# Patient Record
Sex: Male | Born: 1965 | Race: White | Hispanic: No | Marital: Single | Smoking: Current every day smoker
Health system: Southern US, Community
[De-identification: ages and names within clinical notes are randomized; demographics above are authoritative.]

## PROBLEM LIST (undated history)

## (undated) DIAGNOSIS — E7800 Pure hypercholesterolemia, unspecified: Secondary | ICD-10-CM

## (undated) DIAGNOSIS — E119 Type 2 diabetes mellitus without complications: Secondary | ICD-10-CM

## (undated) HISTORY — PX: APPENDECTOMY: SHX54

---

## 2013-04-11 DIAGNOSIS — F3342 Major depressive disorder, recurrent, in full remission: Secondary | ICD-10-CM | POA: Diagnosis not present

## 2013-06-20 DIAGNOSIS — IMO0001 Reserved for inherently not codable concepts without codable children: Secondary | ICD-10-CM | POA: Diagnosis not present

## 2013-06-20 DIAGNOSIS — Z6834 Body mass index (BMI) 34.0-34.9, adult: Secondary | ICD-10-CM | POA: Diagnosis not present

## 2013-06-20 DIAGNOSIS — I10 Essential (primary) hypertension: Secondary | ICD-10-CM | POA: Diagnosis not present

## 2013-07-30 ENCOUNTER — Emergency Department (HOSPITAL_COMMUNITY)
Admission: EM | Admit: 2013-07-30 | Discharge: 2013-07-30 | Disposition: A | Discharge status: Home / Self Care | Payer: Medicare Other | Attending: Emergency Medicine | Admitting: Emergency Medicine

## 2013-07-30 ENCOUNTER — Encounter (HOSPITAL_COMMUNITY): Payer: Self-pay | Admitting: Emergency Medicine

## 2013-07-30 DIAGNOSIS — I10 Essential (primary) hypertension: Secondary | ICD-10-CM | POA: Insufficient documentation

## 2013-07-30 DIAGNOSIS — H9313 Tinnitus, bilateral: Secondary | ICD-10-CM

## 2013-07-30 DIAGNOSIS — F17200 Nicotine dependence, unspecified, uncomplicated: Secondary | ICD-10-CM | POA: Insufficient documentation

## 2013-07-30 DIAGNOSIS — R51 Headache: Secondary | ICD-10-CM | POA: Diagnosis not present

## 2013-07-30 DIAGNOSIS — E7800 Pure hypercholesterolemia, unspecified: Secondary | ICD-10-CM | POA: Diagnosis not present

## 2013-07-30 DIAGNOSIS — Z79899 Other long term (current) drug therapy: Secondary | ICD-10-CM | POA: Insufficient documentation

## 2013-07-30 DIAGNOSIS — H9319 Tinnitus, unspecified ear: Secondary | ICD-10-CM | POA: Diagnosis not present

## 2013-07-30 DIAGNOSIS — E119 Type 2 diabetes mellitus without complications: Secondary | ICD-10-CM | POA: Insufficient documentation

## 2013-07-30 HISTORY — DX: Type 2 diabetes mellitus without complications: E11.9

## 2013-07-30 HISTORY — DX: Pure hypercholesterolemia, unspecified: E78.00

## 2013-07-30 NOTE — Discharge Instructions (Signed)
Tinnitus Sounds you hear in your ears and coming from within the ear is called tinnitus. This can be a symptom of many ear disorders. It is often associated with hearing loss.  Tinnitus can be seen with:  Infections.  Ear blockages such as wax buildup.  Meniere's disease.  Ear damage.  Inherited.  Occupational causes. While irritating, it is not usually a threat to health. When the cause of the tinnitus is wax, infection in the middle ear, or foreign body it is easily treated. Hearing loss will usually be reversible.  TREATMENT  When treating the underlying cause does not get rid of tinnitus, it may be necessary to get rid of the unwanted sound by covering it up with more pleasant background noises. This may include music, the radio etc. Try [employer] website and App. There are tinnitus maskers which can be worn which produce background noise to cover up the tinnitus. Avoid all medications which tend to make tinnitus worse such as alcohol, caffeine, aspirin, and nicotine. There are many soothing background tapes such as rain, ocean, thunderstorms, etc. These soothing sounds help with sleeping or resting. Keep all follow-up appointments and referrals. This is important to identify the cause of the problem. It also helps avoid complications, impaired hearing, disability, or chronic pain. Document Released: 03/16/2005 Document Revised: 06/08/2011 Document Reviewed: 11/02/2007 [facility] Patient Information 2014 [employer], [place].  Not every illness can be identified during an emergency department visit, thus follow-up with your primary healthcare provider is important. Medical conditions can also worsen, so it is also important to return immediately as directed below, or if you have other serious concerns develop. RETURN IMMEDIATELY IF you develop new shortness of breath, chest pain, fever, have difficulty moving parts of your body (new weakness, numbness, or incoordination), sudden change in  speech, vision, swallowing, or understanding, faint or develop new dizziness, severe headache, become poorly responsive or have an altered mental status compared to baseline for you, new rash, abdominal pain, or bloody stools,  Return sooner also if you develop new problems for which you have not talked to your caregiver but you feel may be emergency medical conditions, or are unable to be cared for safely at home.

## 2013-07-30 NOTE — ED Notes (Signed)
Patient reports tinnitus in ears bilaterally and headache since Monday.

## 2013-07-30 NOTE — ED Provider Notes (Signed)
CSN: [id number]     Arrival date & time 07/30/13  1848 History   First MD Initiated Contact with Patient 07/30/13 2109     Chief Complaint  Patient presents with  . Tinnitus  . Headache     (Consider location/radiation/quality/duration/timing/severity/associated sxs/prior Treatment) HPI 48 year old male with gradual onset of bilateral moderate tinnitus without hearing loss without headache without trauma without fever without neck pain without stiff neck without strokelike symptoms without vertigo without change in speech vision swallowing or understanding without weakness numbness or incoordination without chest pain cough shortness of breath without treatment prior to arrival and without other concerns. Past Medical History  Diagnosis Date  . Hypertension   . Diabetes mellitus without complication   . Hypercholesteremia    Past Surgical History  Procedure Laterality Date  . Appendectomy     History reviewed. No pertinent family history. History  Substance Use Topics  . Smoking status: Current Every Day Smoker  . Smokeless tobacco: Not on file  . Alcohol Use: No    Review of Systems  10 Systems reviewed and are negative for acute change except as noted in the HPI.  Allergies  Review of patient's allergies indicates no known allergies.  Home Medications   Prior to Admission medications   Medication Sig Start Date End Date Taking? Authorizing Provider  atorvastatin (LIPITOR) 40 MG tablet Take 40 mg by mouth every morning. 06/21/13  Yes Historical Provider, MD  benazepril-hydrochlorthiazide (LOTENSIN HCT) 10-12.5 MG per tablet Take 0.5 tablets by mouth every morning. 06/20/13  Yes Historical Provider, MD  fenofibrate (TRICOR) 145 MG tablet Take 145 mg by mouth every morning. 07/26/13  Yes Historical Provider, MD  gabapentin (NEURONTIN) 400 MG capsule Take 800 mg by mouth at bedtime. 07/26/13  Yes Historical Provider, MD  glyBURIDE-metformin (GLUCOVANCE) 5-500 MG per tablet Take  1 tablet by mouth 2 (two) times daily. 06/20/13  Yes Historical Provider, MD  KRILL OIL PO Take 1 capsule by mouth 2 (two) times daily.   Yes Historical Provider, MD  LEVEMIR FLEXTOUCH 100 UNIT/ML Pen Inject 17 Units into the skin daily at 10 pm.  07/26/13  Yes Historical Provider, MD  Omega-3 Fatty Acids (FISH OIL PO) Take 1 capsule by mouth 3 (three) times daily.   Yes Historical Provider, MD  risperiDONE (RISPERDAL) 2 MG tablet Take 2 mg by mouth at bedtime. 07/26/13  Yes Historical Provider, MD   BP 152/82  Pulse 82  Temp(Src) 98.2 F (36.8 C) (Oral)  Resp 18  Ht 5\' 2"  (1.575 m)  Wt 240 lb (108.863 kg)  BMI 43.89 kg/m2  SpO2 98% Physical Exam  Nursing note and vitals reviewed. Constitutional:  Awake, alert, nontoxic appearance with baseline speech for patient.  HENT:  Head: Atraumatic.  Mouth/Throat: Oropharynx is clear and moist. No oropharyngeal exudate.  Tympanic membranes clear bilaterally external auditory canals with minimal cerumen bilaterally ears are nontender  Eyes: EOM are normal. Pupils are equal, round, and reactive to light. Right eye exhibits no discharge. Left eye exhibits no discharge.  No nystagmus; negative test of skew  Neck: Neck supple.  Cardiovascular: Normal rate and regular rhythm.   No murmur heard. Pulmonary/Chest: Effort normal and breath sounds normal. No stridor. No respiratory distress. He has no wheezes. He has no rales. He exhibits no tenderness.  Abdominal: Soft. Bowel sounds are normal. He exhibits no mass. There is no tenderness. There is no rebound.  Musculoskeletal: He exhibits no tenderness.  Baseline ROM, moves extremities with no obvious  new focal weakness.  Lymphadenopathy:    He has no cervical adenopathy.  Neurological: He is alert.  Awake, alert, cooperative and aware of situation; motor strength 5/5 bilaterally; sensation normal to light touch bilaterally; peripheral visual fields full to confrontation; no facial asymmetry; tongue  midline; major cranial nerves appear intact; no pronator drift, normal finger to nose bilaterally, baseline gait without new ataxia.  Skin: No rash noted.  Psychiatric: He has a normal mood and affect.    ED Course  Procedures (including critical care time) Patient informed of clinical course, understand medical decision-making process, and agree with plan. Labs Review Labs Reviewed - No data to display  Imaging Review No results found.   EKG Interpretation None      MDM   Final diagnoses:  Tinnitus, bilateral    I doubt any other EMC precluding discharge at this time including, but not necessarily limited to the following:SAH, CVA, SBI.    Hurman HornJohn M Stanislaus Kaltenbach, MD 08/02/13 [phone]

## 2013-09-19 DIAGNOSIS — IMO0001 Reserved for inherently not codable concepts without codable children: Secondary | ICD-10-CM | POA: Diagnosis not present

## 2013-09-19 DIAGNOSIS — Z0000 Encounter for general adult medical examination without abnormal findings: Secondary | ICD-10-CM | POA: Diagnosis not present

## 2013-09-19 DIAGNOSIS — E782 Mixed hyperlipidemia: Secondary | ICD-10-CM | POA: Diagnosis not present

## 2013-09-19 DIAGNOSIS — I10 Essential (primary) hypertension: Secondary | ICD-10-CM | POA: Diagnosis not present

## 2013-09-28 DIAGNOSIS — Z23 Encounter for immunization: Secondary | ICD-10-CM | POA: Diagnosis not present

## 2013-10-11 DIAGNOSIS — IMO0002 Reserved for concepts with insufficient information to code with codable children: Secondary | ICD-10-CM | POA: Diagnosis not present

## 2013-12-19 DIAGNOSIS — IMO0001 Reserved for inherently not codable concepts without codable children: Secondary | ICD-10-CM | POA: Diagnosis not present

## 2013-12-19 DIAGNOSIS — I10 Essential (primary) hypertension: Secondary | ICD-10-CM | POA: Diagnosis not present

## 2014-03-20 DIAGNOSIS — I10 Essential (primary) hypertension: Secondary | ICD-10-CM | POA: Diagnosis not present

## 2014-03-20 DIAGNOSIS — E1165 Type 2 diabetes mellitus with hyperglycemia: Secondary | ICD-10-CM | POA: Diagnosis not present

## 2014-04-10 DIAGNOSIS — F3342 Major depressive disorder, recurrent, in full remission: Secondary | ICD-10-CM | POA: Diagnosis not present

## 2014-06-19 DIAGNOSIS — E1165 Type 2 diabetes mellitus with hyperglycemia: Secondary | ICD-10-CM | POA: Diagnosis not present

## 2014-06-19 DIAGNOSIS — I10 Essential (primary) hypertension: Secondary | ICD-10-CM | POA: Diagnosis not present

## 2014-09-18 DIAGNOSIS — E1165 Type 2 diabetes mellitus with hyperglycemia: Secondary | ICD-10-CM | POA: Diagnosis not present

## 2014-09-18 DIAGNOSIS — I10 Essential (primary) hypertension: Secondary | ICD-10-CM | POA: Diagnosis not present

## 2014-10-09 DIAGNOSIS — F3342 Major depressive disorder, recurrent, in full remission: Secondary | ICD-10-CM | POA: Diagnosis not present

## 2014-11-06 DIAGNOSIS — E119 Type 2 diabetes mellitus without complications: Secondary | ICD-10-CM | POA: Diagnosis not present

## 2014-12-18 DIAGNOSIS — E784 Other hyperlipidemia: Secondary | ICD-10-CM | POA: Diagnosis not present

## 2014-12-18 DIAGNOSIS — E1165 Type 2 diabetes mellitus with hyperglycemia: Secondary | ICD-10-CM | POA: Diagnosis not present

## 2014-12-18 DIAGNOSIS — I10 Essential (primary) hypertension: Secondary | ICD-10-CM | POA: Diagnosis not present

## 2014-12-18 DIAGNOSIS — Z1389 Encounter for screening for other disorder: Secondary | ICD-10-CM | POA: Diagnosis not present

## 2014-12-18 DIAGNOSIS — Z0000 Encounter for general adult medical examination without abnormal findings: Secondary | ICD-10-CM | POA: Diagnosis not present

## 2015-03-19 DIAGNOSIS — E784 Other hyperlipidemia: Secondary | ICD-10-CM | POA: Diagnosis not present

## 2015-03-19 DIAGNOSIS — E1165 Type 2 diabetes mellitus with hyperglycemia: Secondary | ICD-10-CM | POA: Diagnosis not present

## 2015-03-19 DIAGNOSIS — I10 Essential (primary) hypertension: Secondary | ICD-10-CM | POA: Diagnosis not present

## 2015-04-09 DIAGNOSIS — F3342 Major depressive disorder, recurrent, in full remission: Secondary | ICD-10-CM | POA: Diagnosis not present

## 2015-06-18 DIAGNOSIS — E1165 Type 2 diabetes mellitus with hyperglycemia: Secondary | ICD-10-CM | POA: Diagnosis not present

## 2015-06-18 DIAGNOSIS — E784 Other hyperlipidemia: Secondary | ICD-10-CM | POA: Diagnosis not present

## 2015-06-18 DIAGNOSIS — L2089 Other atopic dermatitis: Secondary | ICD-10-CM | POA: Diagnosis not present

## 2015-06-18 DIAGNOSIS — I10 Essential (primary) hypertension: Secondary | ICD-10-CM | POA: Diagnosis not present

## 2015-07-07 DIAGNOSIS — L309 Dermatitis, unspecified: Secondary | ICD-10-CM | POA: Diagnosis not present

## 2015-07-07 DIAGNOSIS — E119 Type 2 diabetes mellitus without complications: Secondary | ICD-10-CM | POA: Diagnosis not present

## 2015-07-07 DIAGNOSIS — F17200 Nicotine dependence, unspecified, uncomplicated: Secondary | ICD-10-CM | POA: Diagnosis not present

## 2015-07-07 DIAGNOSIS — Z79899 Other long term (current) drug therapy: Secondary | ICD-10-CM | POA: Diagnosis not present

## 2015-09-17 DIAGNOSIS — I10 Essential (primary) hypertension: Secondary | ICD-10-CM | POA: Diagnosis not present

## 2015-09-17 DIAGNOSIS — E1165 Type 2 diabetes mellitus with hyperglycemia: Secondary | ICD-10-CM | POA: Diagnosis not present

## 2015-09-17 DIAGNOSIS — E784 Other hyperlipidemia: Secondary | ICD-10-CM | POA: Diagnosis not present

## 2015-09-24 DIAGNOSIS — F411 Generalized anxiety disorder: Secondary | ICD-10-CM | POA: Diagnosis not present

## 2015-09-24 DIAGNOSIS — F3342 Major depressive disorder, recurrent, in full remission: Secondary | ICD-10-CM | POA: Diagnosis not present

## 2015-11-09 DIAGNOSIS — I10 Essential (primary) hypertension: Secondary | ICD-10-CM | POA: Diagnosis not present

## 2015-11-09 DIAGNOSIS — F17200 Nicotine dependence, unspecified, uncomplicated: Secondary | ICD-10-CM | POA: Diagnosis not present

## 2015-11-09 DIAGNOSIS — E119 Type 2 diabetes mellitus without complications: Secondary | ICD-10-CM | POA: Diagnosis not present

## 2015-11-09 DIAGNOSIS — Z79899 Other long term (current) drug therapy: Secondary | ICD-10-CM | POA: Diagnosis not present

## 2015-11-09 DIAGNOSIS — G44209 Tension-type headache, unspecified, not intractable: Secondary | ICD-10-CM | POA: Diagnosis not present

## 2015-11-09 DIAGNOSIS — Z794 Long term (current) use of insulin: Secondary | ICD-10-CM | POA: Diagnosis not present

## 2015-12-17 DIAGNOSIS — Z1389 Encounter for screening for other disorder: Secondary | ICD-10-CM | POA: Diagnosis not present

## 2015-12-17 DIAGNOSIS — Z23 Encounter for immunization: Secondary | ICD-10-CM | POA: Diagnosis not present

## 2015-12-17 DIAGNOSIS — E1165 Type 2 diabetes mellitus with hyperglycemia: Secondary | ICD-10-CM | POA: Diagnosis not present

## 2015-12-17 DIAGNOSIS — I10 Essential (primary) hypertension: Secondary | ICD-10-CM | POA: Diagnosis not present

## 2015-12-17 DIAGNOSIS — Z0000 Encounter for general adult medical examination without abnormal findings: Secondary | ICD-10-CM | POA: Diagnosis not present

## 2015-12-17 DIAGNOSIS — E784 Other hyperlipidemia: Secondary | ICD-10-CM | POA: Diagnosis not present

## 2015-12-31 DIAGNOSIS — Z7984 Long term (current) use of oral hypoglycemic drugs: Secondary | ICD-10-CM | POA: Diagnosis not present

## 2015-12-31 DIAGNOSIS — E119 Type 2 diabetes mellitus without complications: Secondary | ICD-10-CM | POA: Diagnosis not present

## 2015-12-31 DIAGNOSIS — H524 Presbyopia: Secondary | ICD-10-CM | POA: Diagnosis not present

## 2016-03-24 DIAGNOSIS — E6609 Other obesity due to excess calories: Secondary | ICD-10-CM | POA: Diagnosis not present

## 2016-03-24 DIAGNOSIS — E784 Other hyperlipidemia: Secondary | ICD-10-CM | POA: Diagnosis not present

## 2016-03-24 DIAGNOSIS — E1165 Type 2 diabetes mellitus with hyperglycemia: Secondary | ICD-10-CM | POA: Diagnosis not present

## 2016-03-24 DIAGNOSIS — I10 Essential (primary) hypertension: Secondary | ICD-10-CM | POA: Diagnosis not present

## 2016-05-05 DIAGNOSIS — F330 Major depressive disorder, recurrent, mild: Secondary | ICD-10-CM | POA: Diagnosis not present

## 2016-05-05 DIAGNOSIS — F411 Generalized anxiety disorder: Secondary | ICD-10-CM | POA: Diagnosis not present

## 2016-06-23 DIAGNOSIS — E6609 Other obesity due to excess calories: Secondary | ICD-10-CM | POA: Diagnosis not present

## 2016-06-23 DIAGNOSIS — E1165 Type 2 diabetes mellitus with hyperglycemia: Secondary | ICD-10-CM | POA: Diagnosis not present

## 2016-06-23 DIAGNOSIS — E784 Other hyperlipidemia: Secondary | ICD-10-CM | POA: Diagnosis not present

## 2016-06-23 DIAGNOSIS — I10 Essential (primary) hypertension: Secondary | ICD-10-CM | POA: Diagnosis not present

## 2016-07-14 DIAGNOSIS — H6983 Other specified disorders of Eustachian tube, bilateral: Secondary | ICD-10-CM | POA: Diagnosis not present

## 2016-07-14 DIAGNOSIS — H6123 Impacted cerumen, bilateral: Secondary | ICD-10-CM | POA: Diagnosis not present

## 2016-07-14 DIAGNOSIS — H9012 Conductive hearing loss, unilateral, left ear, with unrestricted hearing on the contralateral side: Secondary | ICD-10-CM | POA: Diagnosis not present

## 2016-07-14 DIAGNOSIS — H903 Sensorineural hearing loss, bilateral: Secondary | ICD-10-CM | POA: Diagnosis not present

## 2016-07-28 DIAGNOSIS — H9313 Tinnitus, bilateral: Secondary | ICD-10-CM | POA: Diagnosis not present

## 2016-07-28 DIAGNOSIS — J310 Chronic rhinitis: Secondary | ICD-10-CM | POA: Diagnosis not present

## 2016-09-20 ENCOUNTER — Emergency Department (HOSPITAL_COMMUNITY)
Admission: EM | Admit: 2016-09-20 | Discharge: 2016-09-20 | Disposition: A | Discharge status: Home Health | Payer: Medicare Other | Attending: Emergency Medicine | Admitting: Emergency Medicine

## 2016-09-20 ENCOUNTER — Encounter (HOSPITAL_COMMUNITY): Payer: Self-pay | Admitting: Emergency Medicine

## 2016-09-20 DIAGNOSIS — E119 Type 2 diabetes mellitus without complications: Secondary | ICD-10-CM | POA: Diagnosis not present

## 2016-09-20 DIAGNOSIS — F17200 Nicotine dependence, unspecified, uncomplicated: Secondary | ICD-10-CM | POA: Diagnosis not present

## 2016-09-20 DIAGNOSIS — M542 Cervicalgia: Secondary | ICD-10-CM | POA: Diagnosis not present

## 2016-09-20 DIAGNOSIS — M62838 Other muscle spasm: Secondary | ICD-10-CM | POA: Insufficient documentation

## 2016-09-20 DIAGNOSIS — R252 Cramp and spasm: Secondary | ICD-10-CM | POA: Diagnosis not present

## 2016-09-20 DIAGNOSIS — Z7984 Long term (current) use of oral hypoglycemic drugs: Secondary | ICD-10-CM | POA: Insufficient documentation

## 2016-09-20 DIAGNOSIS — F17210 Nicotine dependence, cigarettes, uncomplicated: Secondary | ICD-10-CM | POA: Diagnosis not present

## 2016-09-20 DIAGNOSIS — Z794 Long term (current) use of insulin: Secondary | ICD-10-CM | POA: Diagnosis not present

## 2016-09-20 DIAGNOSIS — Z79899 Other long term (current) drug therapy: Secondary | ICD-10-CM | POA: Diagnosis not present

## 2016-09-20 DIAGNOSIS — Z87898 Personal history of other specified conditions: Secondary | ICD-10-CM | POA: Diagnosis not present

## 2016-09-20 MED ORDER — CYCLOBENZAPRINE HCL 5 MG PO TABS
5.0000 mg | ORAL_TABLET | Freq: Three times a day (TID) | ORAL | 0 refills | Status: AC | PRN
Start: 2016-09-20 — End: ?

## 2016-09-20 NOTE — ED Provider Notes (Signed)
[facility]EMERGENCY DEPT Provider Note   CSN: [id number] Arrival date & time: 09/20/16  1535     History   Chief Complaint Chief Complaint  Patient presents with  . Neck Pain    HPI Ivan Daugherty is a 51 y.o. male presenting with a 4 day history of left neck and shoulder stiffness and pain with muscle spasm.  It started when he was lying on the sofa resting his head on his hand watching tv and has persisted. He denies neck injury, fevers, chills, fall and denies numbness or weakness in his arms.  He has used aleve and an otc muscle rub without relief.  HPI  Past Medical History:  Diagnosis Date  . Diabetes mellitus without complication (HCC)   . Hypercholesteremia     There are no active problems to display for this patient.   Past Surgical History:  Procedure Laterality Date  . APPENDECTOMY         Home Medications    Prior to Admission medications   Medication Sig Start Date End Date Taking? Authorizing Provider  atorvastatin (LIPITOR) 40 MG tablet Take 40 mg by mouth every morning. 06/21/13   [provider]  benazepril-hydrochlorthiazide (LOTENSIN HCT) 10-12.5 MG per tablet Take 0.5 tablets by mouth every morning. 06/20/13   [provider]  cyclobenzaprine (FLEXERIL) 5 MG tablet Take 1 tablet (5 mg total) by mouth 3 (three) times daily as needed for muscle spasms. 09/20/16   Burgess AmorIdol, Agustus Mane, PA-C  fenofibrate (TRICOR) 145 MG tablet Take 145 mg by mouth every morning. 07/26/13   [provider]  gabapentin (NEURONTIN) 400 MG capsule Take 800 mg by mouth at bedtime. 07/26/13   [provider]  glyBURIDE-metformin (GLUCOVANCE) 5-500 MG per tablet Take 1 tablet by mouth 2 (two) times daily. 06/20/13   [provider]  KRILL OIL PO Take 1 capsule by mouth 2 (two) times daily.    [provider]  LEVEMIR FLEXTOUCH 100 UNIT/ML Pen Inject 17 Units into the skin daily at 10 pm.  07/26/13   [provider]  Omega-3 Fatty Acids  (FISH OIL PO) Take 1 capsule by mouth 3 (three) times daily.    [provider]  risperiDONE (RISPERDAL) 2 MG tablet Take 2 mg by mouth at bedtime. 07/26/13   [provider]    Family History No family history on file.  Social History Social History  Substance Use Topics  . Smoking status: Current Every Day Smoker    Packs/day: 1.25    Years: 36.00    Types: Cigarettes  . Smokeless tobacco: Never Used  . Alcohol use No     Allergies   Patient has no known allergies.   Review of Systems Review of Systems  Constitutional: Negative for fever.  Musculoskeletal: Positive for neck pain. Negative for arthralgias, joint swelling and myalgias.  Neurological: Negative for weakness and numbness.     Physical Exam Updated Vital Signs BP (!) 147/73 (BP Location: Right Arm)   Pulse 83   Temp 98.3 F (36.8 C) (Oral)   Resp 19   Ht 5\' 8"  (1.727 m)   Wt 99.8 kg (220 lb)   SpO2 99%   BMI 33.45 kg/m   Physical Exam  Constitutional: He appears well-developed and well-nourished.  HENT:  Head: Atraumatic.  Neck: Normal range of motion.  Cardiovascular:  Pulses equal bilaterally  Musculoskeletal: He exhibits tenderness.       Cervical back: He exhibits tenderness and spasm. He exhibits no  bony tenderness.       Back:  Neurological: He is alert. He has normal strength. He displays normal reflexes. No sensory deficit.  Reflex Scores:      Bicep reflexes are 2+ on the right side and 2+ on the left side. Equal grip strength.  Skin: Skin is warm and dry.  Psychiatric: He has a normal mood and affect.     ED Treatments / Results  Labs (all labs ordered are listed, but only abnormal results are displayed) Labs Reviewed - No data to display  EKG  EKG Interpretation None       Radiology No results found.  Procedures Procedures (including critical care time)  Medications Ordered in ED Medications - No data to display   Initial Impression /  Assessment and Plan / ED Course  I have reviewed the triage vital signs and the nursing notes.  Pertinent labs & imaging results that were available during my care of the patient were reviewed by me and considered in my medical decision making (see chart for details).     Pt with left trapezius muscle strain/spasm.  Heat, ROM exercises, flexeril.  Prn f/u with pcp if not improved x 1 week.  Final Clinical Impressions(s) / ED Diagnoses   Final diagnoses:  Muscle spasms of neck    New Prescriptions New Prescriptions   CYCLOBENZAPRINE (FLEXERIL) 5 MG TABLET    Take 1 tablet (5 mg total) by mouth 3 (three) times daily as needed for muscle spasms.     Burgess Amor, PA-C 09/20/16 1747    Samuel Jester, DO 09/23/16 [phone]

## 2016-09-20 NOTE — ED Triage Notes (Addendum)
Patient c/o neck pain. Per patient started 4 days ago after feeling spasm in shoulder. Patient states worse with movement and difficulty turning head. Patient reports using Aleve and muscle ointment/spray with no relief.

## 2016-09-20 NOTE — Discharge Instructions (Signed)
As discussed, apply a heating pad for 20 minutes to your neck after which do a few stretching exercises as discussed.  Use caution with taking the muscle relaxer - this can make you sleepy.

## 2016-09-29 DIAGNOSIS — E6609 Other obesity due to excess calories: Secondary | ICD-10-CM | POA: Diagnosis not present

## 2016-09-29 DIAGNOSIS — I10 Essential (primary) hypertension: Secondary | ICD-10-CM | POA: Diagnosis not present

## 2016-09-29 DIAGNOSIS — E784 Other hyperlipidemia: Secondary | ICD-10-CM | POA: Diagnosis not present

## 2016-09-29 DIAGNOSIS — E1165 Type 2 diabetes mellitus with hyperglycemia: Secondary | ICD-10-CM | POA: Diagnosis not present

## 2016-11-03 DIAGNOSIS — F411 Generalized anxiety disorder: Secondary | ICD-10-CM | POA: Diagnosis not present

## 2016-11-03 DIAGNOSIS — F3342 Major depressive disorder, recurrent, in full remission: Secondary | ICD-10-CM | POA: Diagnosis not present

## 2017-01-05 DIAGNOSIS — Z1389 Encounter for screening for other disorder: Secondary | ICD-10-CM | POA: Diagnosis not present

## 2017-01-05 DIAGNOSIS — E6609 Other obesity due to excess calories: Secondary | ICD-10-CM | POA: Diagnosis not present

## 2017-01-05 DIAGNOSIS — E119 Type 2 diabetes mellitus without complications: Secondary | ICD-10-CM | POA: Diagnosis not present

## 2017-01-05 DIAGNOSIS — E7849 Other hyperlipidemia: Secondary | ICD-10-CM | POA: Diagnosis not present

## 2017-01-05 DIAGNOSIS — E1165 Type 2 diabetes mellitus with hyperglycemia: Secondary | ICD-10-CM | POA: Diagnosis not present

## 2017-01-05 DIAGNOSIS — I10 Essential (primary) hypertension: Secondary | ICD-10-CM | POA: Diagnosis not present

## 2017-01-05 DIAGNOSIS — Z0000 Encounter for general adult medical examination without abnormal findings: Secondary | ICD-10-CM | POA: Diagnosis not present

## 2017-01-05 DIAGNOSIS — Z125 Encounter for screening for malignant neoplasm of prostate: Secondary | ICD-10-CM | POA: Diagnosis not present

## 2017-01-19 DIAGNOSIS — Z23 Encounter for immunization: Secondary | ICD-10-CM | POA: Diagnosis not present

## 2017-02-02 DIAGNOSIS — H60332 Swimmer's ear, left ear: Secondary | ICD-10-CM | POA: Diagnosis not present

## 2017-02-16 DIAGNOSIS — H60333 Swimmer's ear, bilateral: Secondary | ICD-10-CM | POA: Diagnosis not present

## 2017-02-23 DIAGNOSIS — H6983 Other specified disorders of Eustachian tube, bilateral: Secondary | ICD-10-CM | POA: Diagnosis not present

## 2017-02-23 DIAGNOSIS — H60332 Swimmer's ear, left ear: Secondary | ICD-10-CM | POA: Diagnosis not present

## 2017-03-02 DIAGNOSIS — E11319 Type 2 diabetes mellitus with unspecified diabetic retinopathy without macular edema: Secondary | ICD-10-CM | POA: Diagnosis not present

## 2017-04-13 DIAGNOSIS — I10 Essential (primary) hypertension: Secondary | ICD-10-CM | POA: Diagnosis not present

## 2017-04-13 DIAGNOSIS — E119 Type 2 diabetes mellitus without complications: Secondary | ICD-10-CM | POA: Diagnosis not present

## 2017-04-13 DIAGNOSIS — E7849 Other hyperlipidemia: Secondary | ICD-10-CM | POA: Diagnosis not present

## 2017-04-13 DIAGNOSIS — L702 Acne varioliformis: Secondary | ICD-10-CM | POA: Diagnosis not present

## 2017-04-13 DIAGNOSIS — E6609 Other obesity due to excess calories: Secondary | ICD-10-CM | POA: Diagnosis not present

## 2017-05-04 DIAGNOSIS — F3342 Major depressive disorder, recurrent, in full remission: Secondary | ICD-10-CM | POA: Diagnosis not present

## 2017-05-25 DIAGNOSIS — H60332 Swimmer's ear, left ear: Secondary | ICD-10-CM | POA: Diagnosis not present

## 2017-06-15 DIAGNOSIS — H6122 Impacted cerumen, left ear: Secondary | ICD-10-CM | POA: Diagnosis not present

## 2017-06-15 DIAGNOSIS — H608X3 Other otitis externa, bilateral: Secondary | ICD-10-CM | POA: Diagnosis not present

## 2017-07-20 DIAGNOSIS — E119 Type 2 diabetes mellitus without complications: Secondary | ICD-10-CM | POA: Diagnosis not present

## 2017-07-20 DIAGNOSIS — E7849 Other hyperlipidemia: Secondary | ICD-10-CM | POA: Diagnosis not present

## 2017-07-20 DIAGNOSIS — L702 Acne varioliformis: Secondary | ICD-10-CM | POA: Diagnosis not present

## 2017-07-20 DIAGNOSIS — E6609 Other obesity due to excess calories: Secondary | ICD-10-CM | POA: Diagnosis not present

## 2017-07-20 DIAGNOSIS — I10 Essential (primary) hypertension: Secondary | ICD-10-CM | POA: Diagnosis not present

## 2017-09-21 DIAGNOSIS — H60332 Swimmer's ear, left ear: Secondary | ICD-10-CM | POA: Diagnosis not present

## 2017-09-28 DIAGNOSIS — H9313 Tinnitus, bilateral: Secondary | ICD-10-CM | POA: Diagnosis not present

## 2017-09-28 DIAGNOSIS — H60332 Swimmer's ear, left ear: Secondary | ICD-10-CM | POA: Diagnosis not present

## 2017-09-28 DIAGNOSIS — H838X3 Other specified diseases of inner ear, bilateral: Secondary | ICD-10-CM | POA: Diagnosis not present

## 2017-09-28 DIAGNOSIS — H903 Sensorineural hearing loss, bilateral: Secondary | ICD-10-CM | POA: Diagnosis not present

## 2017-11-02 DIAGNOSIS — I10 Essential (primary) hypertension: Secondary | ICD-10-CM | POA: Diagnosis not present

## 2017-11-02 DIAGNOSIS — E6609 Other obesity due to excess calories: Secondary | ICD-10-CM | POA: Diagnosis not present

## 2017-11-02 DIAGNOSIS — F3342 Major depressive disorder, recurrent, in full remission: Secondary | ICD-10-CM | POA: Diagnosis not present

## 2017-11-02 DIAGNOSIS — L702 Acne varioliformis: Secondary | ICD-10-CM | POA: Diagnosis not present

## 2017-11-02 DIAGNOSIS — F411 Generalized anxiety disorder: Secondary | ICD-10-CM | POA: Diagnosis not present

## 2017-11-02 DIAGNOSIS — E119 Type 2 diabetes mellitus without complications: Secondary | ICD-10-CM | POA: Diagnosis not present

## 2017-11-02 DIAGNOSIS — E7849 Other hyperlipidemia: Secondary | ICD-10-CM | POA: Diagnosis not present

## 2017-11-23 DIAGNOSIS — H60332 Swimmer's ear, left ear: Secondary | ICD-10-CM | POA: Diagnosis not present

## 2017-11-24 ENCOUNTER — Other Ambulatory Visit (INDEPENDENT_AMBULATORY_CARE_PROVIDER_SITE_OTHER): Payer: Self-pay | Admitting: Otolaryngology

## 2017-11-24 DIAGNOSIS — H7190 Unspecified cholesteatoma, unspecified ear: Secondary | ICD-10-CM

## 2017-11-30 ENCOUNTER — Ambulatory Visit (HOSPITAL_COMMUNITY)
Admission: RE | Admit: 2017-11-30 | Discharge: 2017-11-30 | Disposition: A | Discharge status: Home / Self Care | Payer: Medicare Other | Source: Ambulatory Visit | Attending: Otolaryngology | Admitting: Otolaryngology

## 2017-11-30 DIAGNOSIS — H6042 Cholesteatoma of left external ear: Secondary | ICD-10-CM | POA: Diagnosis not present

## 2017-11-30 DIAGNOSIS — H7190 Unspecified cholesteatoma, unspecified ear: Secondary | ICD-10-CM | POA: Insufficient documentation

## 2017-12-07 DIAGNOSIS — H60332 Swimmer's ear, left ear: Secondary | ICD-10-CM | POA: Diagnosis not present

## 2018-02-01 DIAGNOSIS — H672 Otitis media in diseases classified elsewhere, left ear: Secondary | ICD-10-CM | POA: Diagnosis not present

## 2018-02-01 DIAGNOSIS — H60332 Swimmer's ear, left ear: Secondary | ICD-10-CM | POA: Diagnosis not present

## 2018-02-01 DIAGNOSIS — Z0000 Encounter for general adult medical examination without abnormal findings: Secondary | ICD-10-CM | POA: Diagnosis not present

## 2018-02-01 DIAGNOSIS — E7849 Other hyperlipidemia: Secondary | ICD-10-CM | POA: Diagnosis not present

## 2018-02-01 DIAGNOSIS — Z1389 Encounter for screening for other disorder: Secondary | ICD-10-CM | POA: Diagnosis not present

## 2018-02-01 DIAGNOSIS — E6609 Other obesity due to excess calories: Secondary | ICD-10-CM | POA: Diagnosis not present

## 2018-02-01 DIAGNOSIS — I10 Essential (primary) hypertension: Secondary | ICD-10-CM | POA: Diagnosis not present

## 2018-02-01 DIAGNOSIS — L702 Acne varioliformis: Secondary | ICD-10-CM | POA: Diagnosis not present

## 2018-02-01 DIAGNOSIS — E119 Type 2 diabetes mellitus without complications: Secondary | ICD-10-CM | POA: Diagnosis not present

## 2018-02-01 DIAGNOSIS — Z125 Encounter for screening for malignant neoplasm of prostate: Secondary | ICD-10-CM | POA: Diagnosis not present

## 2018-02-22 DIAGNOSIS — L309 Dermatitis, unspecified: Secondary | ICD-10-CM | POA: Diagnosis not present

## 2018-02-22 DIAGNOSIS — E6609 Other obesity due to excess calories: Secondary | ICD-10-CM | POA: Diagnosis not present

## 2018-02-22 DIAGNOSIS — Z23 Encounter for immunization: Secondary | ICD-10-CM | POA: Diagnosis not present

## 2018-03-01 DIAGNOSIS — H60332 Swimmer's ear, left ear: Secondary | ICD-10-CM | POA: Diagnosis not present

## 2018-04-05 DIAGNOSIS — H66012 Acute suppurative otitis media with spontaneous rupture of ear drum, left ear: Secondary | ICD-10-CM | POA: Diagnosis not present

## 2018-04-26 DIAGNOSIS — H66012 Acute suppurative otitis media with spontaneous rupture of ear drum, left ear: Secondary | ICD-10-CM | POA: Diagnosis not present

## 2018-05-03 DIAGNOSIS — F411 Generalized anxiety disorder: Secondary | ICD-10-CM | POA: Diagnosis not present

## 2018-05-03 DIAGNOSIS — F3342 Major depressive disorder, recurrent, in full remission: Secondary | ICD-10-CM | POA: Diagnosis not present

## 2018-05-10 DIAGNOSIS — L409 Psoriasis, unspecified: Secondary | ICD-10-CM | POA: Diagnosis not present

## 2018-05-10 DIAGNOSIS — E785 Hyperlipidemia, unspecified: Secondary | ICD-10-CM | POA: Diagnosis not present

## 2018-05-10 DIAGNOSIS — E119 Type 2 diabetes mellitus without complications: Secondary | ICD-10-CM | POA: Diagnosis not present

## 2018-05-10 DIAGNOSIS — I10 Essential (primary) hypertension: Secondary | ICD-10-CM | POA: Diagnosis not present

## 2018-05-10 DIAGNOSIS — E6609 Other obesity due to excess calories: Secondary | ICD-10-CM | POA: Diagnosis not present

## 2018-08-16 DIAGNOSIS — L409 Psoriasis, unspecified: Secondary | ICD-10-CM | POA: Diagnosis not present

## 2018-08-16 DIAGNOSIS — E6609 Other obesity due to excess calories: Secondary | ICD-10-CM | POA: Diagnosis not present

## 2018-08-16 DIAGNOSIS — E119 Type 2 diabetes mellitus without complications: Secondary | ICD-10-CM | POA: Diagnosis not present

## 2018-08-16 DIAGNOSIS — I10 Essential (primary) hypertension: Secondary | ICD-10-CM | POA: Diagnosis not present

## 2018-08-16 DIAGNOSIS — E785 Hyperlipidemia, unspecified: Secondary | ICD-10-CM | POA: Diagnosis not present

## 2018-08-30 DIAGNOSIS — H608X3 Other otitis externa, bilateral: Secondary | ICD-10-CM | POA: Diagnosis not present

## 2018-08-30 DIAGNOSIS — H6123 Impacted cerumen, bilateral: Secondary | ICD-10-CM | POA: Diagnosis not present

## 2018-11-15 DIAGNOSIS — E11319 Type 2 diabetes mellitus with unspecified diabetic retinopathy without macular edema: Secondary | ICD-10-CM | POA: Diagnosis not present

## 2018-11-22 DIAGNOSIS — E785 Hyperlipidemia, unspecified: Secondary | ICD-10-CM | POA: Diagnosis not present

## 2018-11-22 DIAGNOSIS — E119 Type 2 diabetes mellitus without complications: Secondary | ICD-10-CM | POA: Diagnosis not present

## 2018-11-22 DIAGNOSIS — L409 Psoriasis, unspecified: Secondary | ICD-10-CM | POA: Diagnosis not present

## 2018-11-22 DIAGNOSIS — I10 Essential (primary) hypertension: Secondary | ICD-10-CM | POA: Diagnosis not present

## 2018-12-06 DIAGNOSIS — H608X3 Other otitis externa, bilateral: Secondary | ICD-10-CM | POA: Diagnosis not present

## 2018-12-06 DIAGNOSIS — H6123 Impacted cerumen, bilateral: Secondary | ICD-10-CM | POA: Diagnosis not present

## 2018-12-13 DIAGNOSIS — F3342 Major depressive disorder, recurrent, in full remission: Secondary | ICD-10-CM | POA: Diagnosis not present

## 2018-12-13 DIAGNOSIS — F411 Generalized anxiety disorder: Secondary | ICD-10-CM | POA: Diagnosis not present

## 2019-02-28 DIAGNOSIS — Z0000 Encounter for general adult medical examination without abnormal findings: Secondary | ICD-10-CM | POA: Diagnosis not present

## 2019-02-28 DIAGNOSIS — Z125 Encounter for screening for malignant neoplasm of prostate: Secondary | ICD-10-CM | POA: Diagnosis not present

## 2019-02-28 DIAGNOSIS — E785 Hyperlipidemia, unspecified: Secondary | ICD-10-CM | POA: Diagnosis not present

## 2019-02-28 DIAGNOSIS — I10 Essential (primary) hypertension: Secondary | ICD-10-CM | POA: Diagnosis not present

## 2019-02-28 DIAGNOSIS — E7849 Other hyperlipidemia: Secondary | ICD-10-CM | POA: Diagnosis not present

## 2019-02-28 DIAGNOSIS — Z23 Encounter for immunization: Secondary | ICD-10-CM | POA: Diagnosis not present

## 2019-02-28 DIAGNOSIS — Z1389 Encounter for screening for other disorder: Secondary | ICD-10-CM | POA: Diagnosis not present

## 2019-02-28 DIAGNOSIS — L409 Psoriasis, unspecified: Secondary | ICD-10-CM | POA: Diagnosis not present

## 2019-02-28 DIAGNOSIS — E119 Type 2 diabetes mellitus without complications: Secondary | ICD-10-CM | POA: Diagnosis not present

## 2019-02-28 DIAGNOSIS — E1165 Type 2 diabetes mellitus with hyperglycemia: Secondary | ICD-10-CM | POA: Diagnosis not present

## 2019-05-06 IMAGING — CT CT TEMPORAL BONES W/O CM
2 of 4 series · 14 of 40 positions shown, 17 images · non-contrast
Comparison: None.

CLINICAL DATA: Cholesteatoma. Left-sided ear infections for
multiple years with dizziness.

EXAM:
CT TEMPORAL BONES WITHOUT CONTRAST
TECHNIQUE: Axial and coronal plane CT imaging of the petrous temporal bones was
performed with thin-collimation image reconstruction. No intravenous
contrast was administered. Multiplanar CT image reconstructions were
also generated.

[Series 3: ax soft · axial · 0.32mm/px · z∈[+299,+391]mm · 12 of 54 slices shown, 15 images]
[im 4/54  brain]
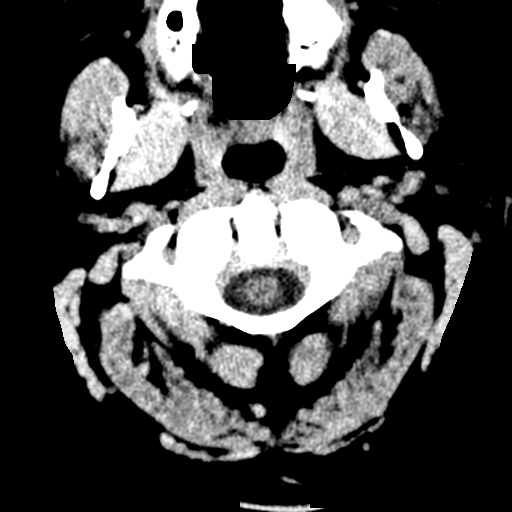
[im 4/54  bone]
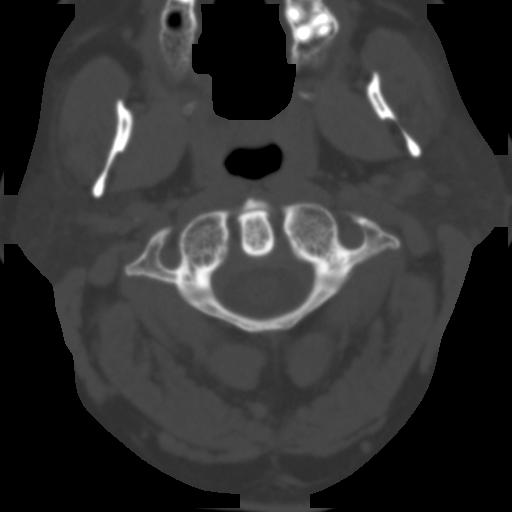
[im 8/54  bone]
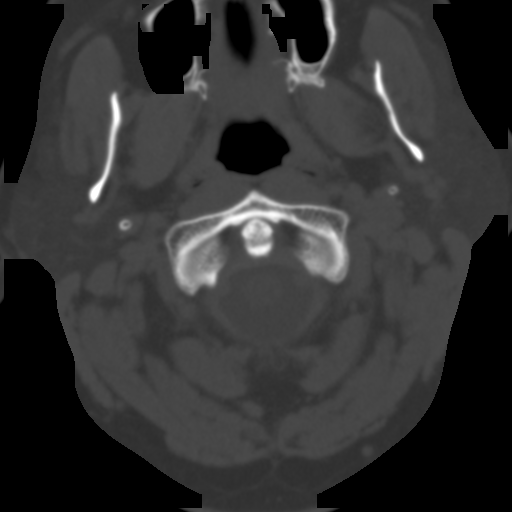
[im 11/54  bone]
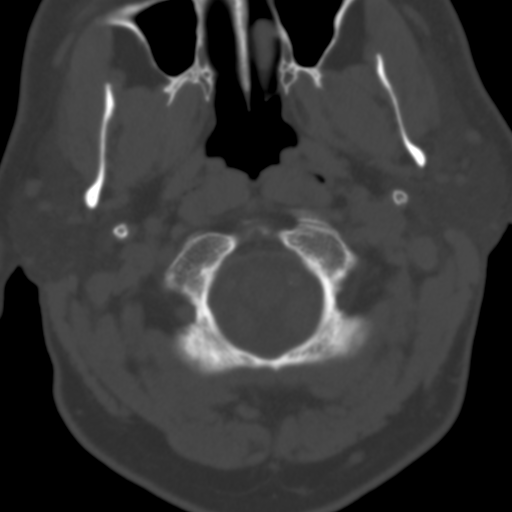
[im 17/54  bone]
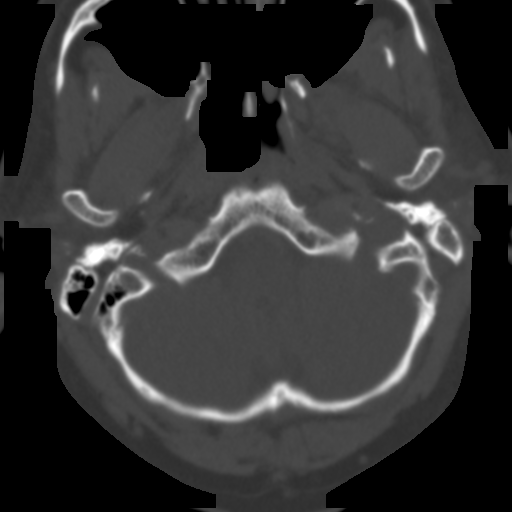
[im 21/54  brain]
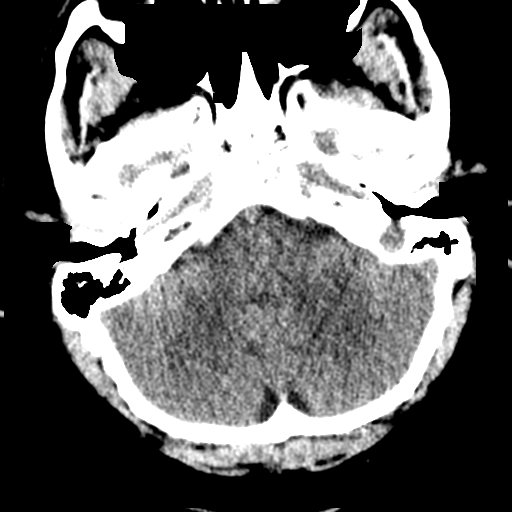
[im 21/54  bone]
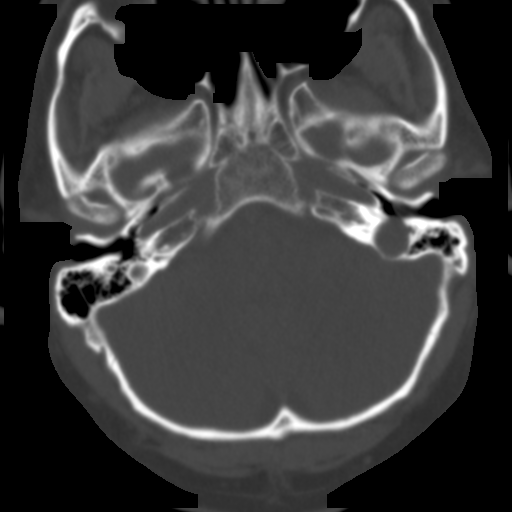
[im 24/54  bone]
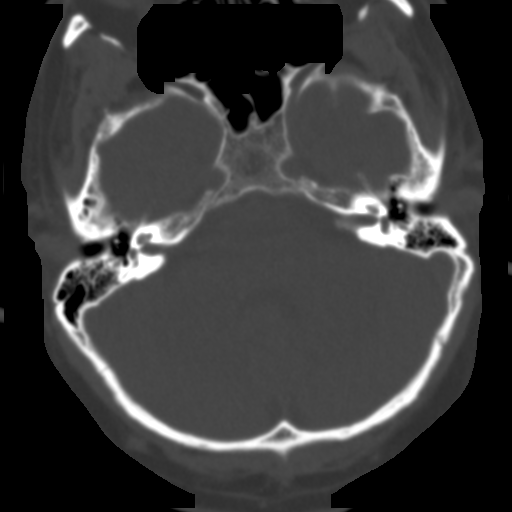
[im 30/54  bone]
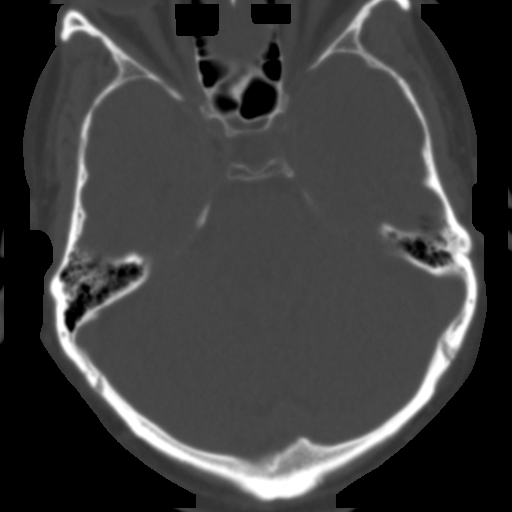
[im 33/54  bone]
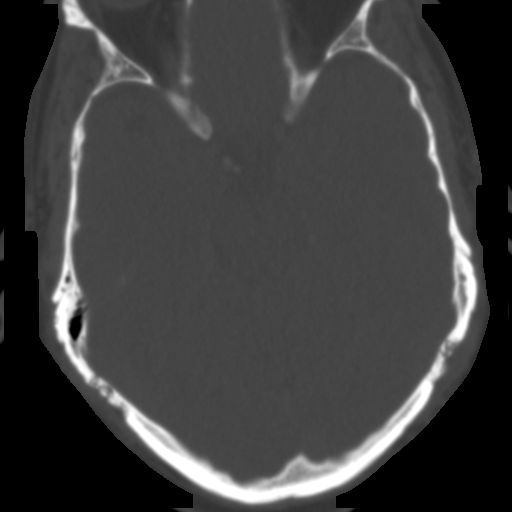
[im 37/54  brain]
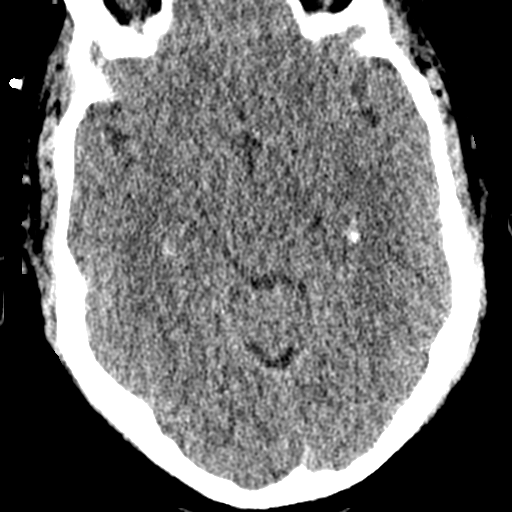
[im 37/54  bone]
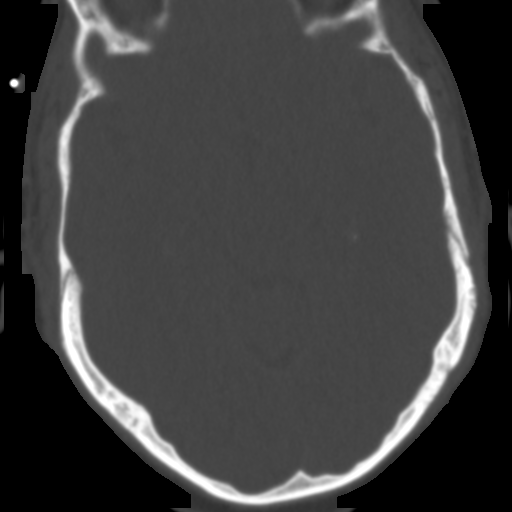
[im 43/54  bone]
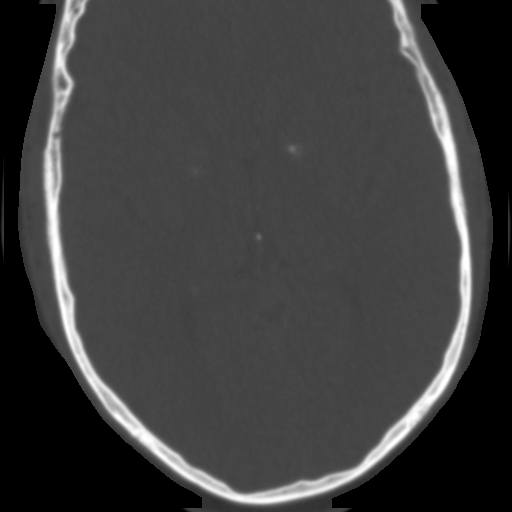
[im 46/54  bone]
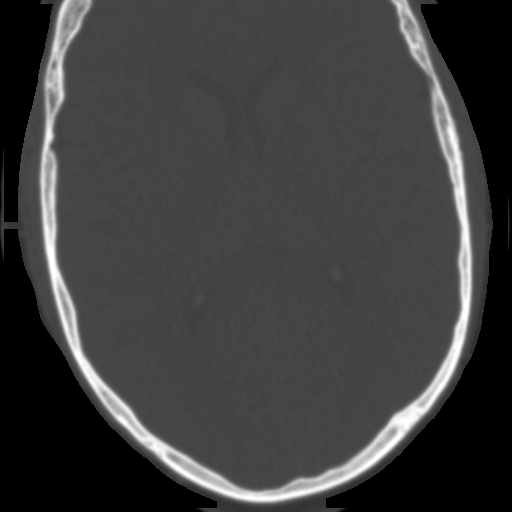
[im 50/54  bone]
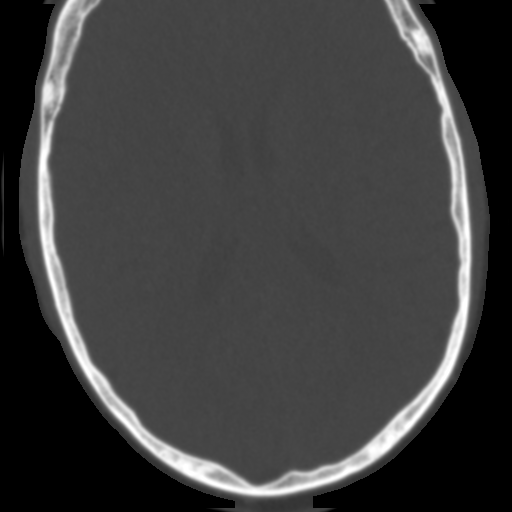

[Series 4: coronal bone. · coronal · 0.23mm/px · 2 of 224 slices shown]
[im 75/224  bone]
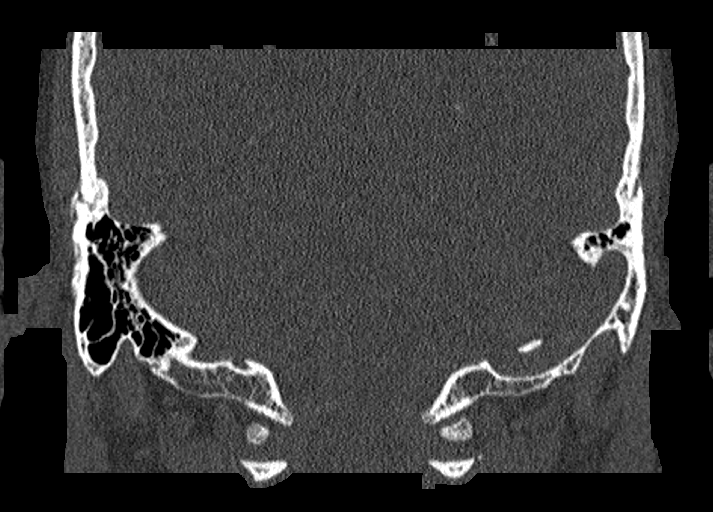
[im 149/224  bone]
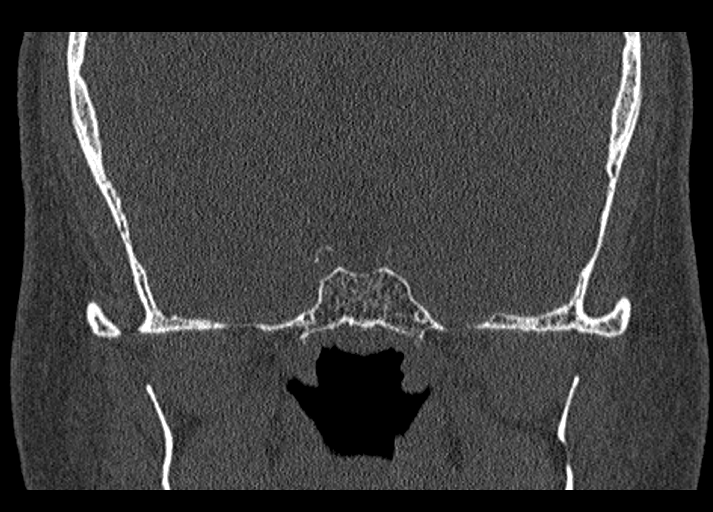

[14 of 40 positions shown; findings below may reference images not displayed]

FINDINGS: RIGHT: The tympanic membrane appears mildly thickened diffusely. The
ossicles appear intact. The tympanic cavity is clear. The internal
auditory canal, cochlea, vestibule, and semicircular canals are
unremarkable. The mastoid air cells are clear.

LEFT: The tympanic membrane appears mildly thickened diffusely.
There is a small amount of more focal soft tissue in the
inferomedial external auditory canal extending to the tympanic
membrane, nonspecific but not frankly masslike and without evidence
of underlying osseous erosion. The ossicles appear intact. The
tympanic cavity is clear. The internal auditory canal, cochlea,
vestibule, and semicircular canals are unremarkable. The mastoid air
cells are clear.

The visualized paranasal sinuses are clear. The visualized portion
of the brain is unremarkable. Visualized orbits are unremarkable.
IMPRESSION: 1. Mildly thickened appearance of the tympanic membranes
bilaterally. Small volume soft tissue medially in the left EAC
without aggressive features.
2. Clear tympanic cavities without evidence of cholesteatoma.

## 2019-06-06 DIAGNOSIS — H608X3 Other otitis externa, bilateral: Secondary | ICD-10-CM | POA: Diagnosis not present

## 2019-06-06 DIAGNOSIS — H6123 Impacted cerumen, bilateral: Secondary | ICD-10-CM | POA: Diagnosis not present

## 2019-06-13 DIAGNOSIS — E1165 Type 2 diabetes mellitus with hyperglycemia: Secondary | ICD-10-CM | POA: Diagnosis not present

## 2019-06-13 DIAGNOSIS — E7849 Other hyperlipidemia: Secondary | ICD-10-CM | POA: Diagnosis not present

## 2019-06-13 DIAGNOSIS — I10 Essential (primary) hypertension: Secondary | ICD-10-CM | POA: Diagnosis not present

## 2019-06-13 DIAGNOSIS — L409 Psoriasis, unspecified: Secondary | ICD-10-CM | POA: Diagnosis not present

## 2019-07-10 DIAGNOSIS — Z23 Encounter for immunization: Secondary | ICD-10-CM | POA: Diagnosis not present

## 2019-08-01 DIAGNOSIS — F411 Generalized anxiety disorder: Secondary | ICD-10-CM | POA: Diagnosis not present

## 2019-08-01 DIAGNOSIS — F3342 Major depressive disorder, recurrent, in full remission: Secondary | ICD-10-CM | POA: Diagnosis not present

## 2019-09-05 DIAGNOSIS — H6123 Impacted cerumen, bilateral: Secondary | ICD-10-CM | POA: Diagnosis not present

## 2019-09-05 DIAGNOSIS — H608X3 Other otitis externa, bilateral: Secondary | ICD-10-CM | POA: Diagnosis not present

## 2019-12-05 DIAGNOSIS — H6122 Impacted cerumen, left ear: Secondary | ICD-10-CM | POA: Diagnosis not present

## 2019-12-05 DIAGNOSIS — H608X2 Other otitis externa, left ear: Secondary | ICD-10-CM | POA: Diagnosis not present

## 2019-12-26 DIAGNOSIS — L409 Psoriasis, unspecified: Secondary | ICD-10-CM | POA: Diagnosis not present

## 2019-12-26 DIAGNOSIS — E7849 Other hyperlipidemia: Secondary | ICD-10-CM | POA: Diagnosis not present

## 2019-12-26 DIAGNOSIS — I10 Essential (primary) hypertension: Secondary | ICD-10-CM | POA: Diagnosis not present

## 2019-12-26 DIAGNOSIS — F17210 Nicotine dependence, cigarettes, uncomplicated: Secondary | ICD-10-CM | POA: Diagnosis not present

## 2019-12-26 DIAGNOSIS — E1169 Type 2 diabetes mellitus with other specified complication: Secondary | ICD-10-CM | POA: Diagnosis not present

## 2020-01-09 DIAGNOSIS — Z23 Encounter for immunization: Secondary | ICD-10-CM | POA: Diagnosis not present

## 2020-01-23 DIAGNOSIS — E11319 Type 2 diabetes mellitus with unspecified diabetic retinopathy without macular edema: Secondary | ICD-10-CM | POA: Diagnosis not present

## 2020-02-04 DIAGNOSIS — F17200 Nicotine dependence, unspecified, uncomplicated: Secondary | ICD-10-CM | POA: Diagnosis not present

## 2020-02-04 DIAGNOSIS — S62316A Displaced fracture of base of fifth metacarpal bone, right hand, initial encounter for closed fracture: Secondary | ICD-10-CM | POA: Diagnosis not present

## 2020-02-04 DIAGNOSIS — I10 Essential (primary) hypertension: Secondary | ICD-10-CM | POA: Diagnosis not present

## 2020-02-04 DIAGNOSIS — E119 Type 2 diabetes mellitus without complications: Secondary | ICD-10-CM | POA: Diagnosis not present

## 2020-02-04 DIAGNOSIS — W228XXA Striking against or struck by other objects, initial encounter: Secondary | ICD-10-CM | POA: Diagnosis not present

## 2020-02-08 DIAGNOSIS — S62316A Displaced fracture of base of fifth metacarpal bone, right hand, initial encounter for closed fracture: Secondary | ICD-10-CM | POA: Diagnosis not present

## 2020-02-08 DIAGNOSIS — M79641 Pain in right hand: Secondary | ICD-10-CM | POA: Diagnosis not present

## 2020-02-27 DIAGNOSIS — F411 Generalized anxiety disorder: Secondary | ICD-10-CM | POA: Diagnosis not present

## 2020-02-27 DIAGNOSIS — F3342 Major depressive disorder, recurrent, in full remission: Secondary | ICD-10-CM | POA: Diagnosis not present

## 2020-03-04 DIAGNOSIS — H6122 Impacted cerumen, left ear: Secondary | ICD-10-CM | POA: Diagnosis not present

## 2020-03-04 DIAGNOSIS — H608X2 Other otitis externa, left ear: Secondary | ICD-10-CM | POA: Diagnosis not present

## 2020-03-19 DIAGNOSIS — Z23 Encounter for immunization: Secondary | ICD-10-CM | POA: Diagnosis not present

## 2020-03-26 DIAGNOSIS — E1169 Type 2 diabetes mellitus with other specified complication: Secondary | ICD-10-CM | POA: Diagnosis not present

## 2020-03-26 DIAGNOSIS — L409 Psoriasis, unspecified: Secondary | ICD-10-CM | POA: Diagnosis not present

## 2020-03-26 DIAGNOSIS — I10 Essential (primary) hypertension: Secondary | ICD-10-CM | POA: Diagnosis not present

## 2020-03-26 DIAGNOSIS — F17210 Nicotine dependence, cigarettes, uncomplicated: Secondary | ICD-10-CM | POA: Diagnosis not present

## 2020-03-26 DIAGNOSIS — E7849 Other hyperlipidemia: Secondary | ICD-10-CM | POA: Diagnosis not present

## 2020-06-04 DIAGNOSIS — H6123 Impacted cerumen, bilateral: Secondary | ICD-10-CM | POA: Diagnosis not present

## 2020-06-04 DIAGNOSIS — H608X2 Other otitis externa, left ear: Secondary | ICD-10-CM | POA: Diagnosis not present

## 2020-06-25 DIAGNOSIS — I10 Essential (primary) hypertension: Secondary | ICD-10-CM | POA: Diagnosis not present

## 2020-06-25 DIAGNOSIS — E7849 Other hyperlipidemia: Secondary | ICD-10-CM | POA: Diagnosis not present

## 2020-06-25 DIAGNOSIS — F17210 Nicotine dependence, cigarettes, uncomplicated: Secondary | ICD-10-CM | POA: Diagnosis not present

## 2020-06-25 DIAGNOSIS — E1169 Type 2 diabetes mellitus with other specified complication: Secondary | ICD-10-CM | POA: Diagnosis not present

## 2020-06-25 DIAGNOSIS — Z125 Encounter for screening for malignant neoplasm of prostate: Secondary | ICD-10-CM | POA: Diagnosis not present

## 2020-06-25 DIAGNOSIS — L409 Psoriasis, unspecified: Secondary | ICD-10-CM | POA: Diagnosis not present

## 2020-09-03 DIAGNOSIS — H6123 Impacted cerumen, bilateral: Secondary | ICD-10-CM | POA: Diagnosis not present

## 2020-09-03 DIAGNOSIS — H60392 Other infective otitis externa, left ear: Secondary | ICD-10-CM | POA: Diagnosis not present

## 2020-10-16 DIAGNOSIS — E1169 Type 2 diabetes mellitus with other specified complication: Secondary | ICD-10-CM | POA: Diagnosis not present

## 2020-10-16 DIAGNOSIS — Z6830 Body mass index (BMI) 30.0-30.9, adult: Secondary | ICD-10-CM | POA: Diagnosis not present

## 2020-10-16 DIAGNOSIS — F17210 Nicotine dependence, cigarettes, uncomplicated: Secondary | ICD-10-CM | POA: Diagnosis not present

## 2020-10-16 DIAGNOSIS — L409 Psoriasis, unspecified: Secondary | ICD-10-CM | POA: Diagnosis not present

## 2020-10-16 DIAGNOSIS — Z0000 Encounter for general adult medical examination without abnormal findings: Secondary | ICD-10-CM | POA: Diagnosis not present

## 2020-10-16 DIAGNOSIS — I10 Essential (primary) hypertension: Secondary | ICD-10-CM | POA: Diagnosis not present

## 2020-10-16 DIAGNOSIS — E7849 Other hyperlipidemia: Secondary | ICD-10-CM | POA: Diagnosis not present

## 2020-12-10 DIAGNOSIS — H60332 Swimmer's ear, left ear: Secondary | ICD-10-CM | POA: Diagnosis not present

## 2021-01-21 DIAGNOSIS — L409 Psoriasis, unspecified: Secondary | ICD-10-CM | POA: Diagnosis not present

## 2021-01-21 DIAGNOSIS — E1169 Type 2 diabetes mellitus with other specified complication: Secondary | ICD-10-CM | POA: Diagnosis not present

## 2021-01-21 DIAGNOSIS — E11319 Type 2 diabetes mellitus with unspecified diabetic retinopathy without macular edema: Secondary | ICD-10-CM | POA: Diagnosis not present

## 2021-01-21 DIAGNOSIS — F17210 Nicotine dependence, cigarettes, uncomplicated: Secondary | ICD-10-CM | POA: Diagnosis not present

## 2021-01-21 DIAGNOSIS — Z6830 Body mass index (BMI) 30.0-30.9, adult: Secondary | ICD-10-CM | POA: Diagnosis not present

## 2021-01-21 DIAGNOSIS — E7849 Other hyperlipidemia: Secondary | ICD-10-CM | POA: Diagnosis not present

## 2021-01-21 DIAGNOSIS — I10 Essential (primary) hypertension: Secondary | ICD-10-CM | POA: Diagnosis not present

## 2021-02-04 DIAGNOSIS — F411 Generalized anxiety disorder: Secondary | ICD-10-CM | POA: Diagnosis not present

## 2021-02-04 DIAGNOSIS — F3342 Major depressive disorder, recurrent, in full remission: Secondary | ICD-10-CM | POA: Diagnosis not present

## 2021-03-11 DIAGNOSIS — H60332 Swimmer's ear, left ear: Secondary | ICD-10-CM | POA: Diagnosis not present

## 2021-04-29 DIAGNOSIS — E7849 Other hyperlipidemia: Secondary | ICD-10-CM | POA: Diagnosis not present

## 2021-04-29 DIAGNOSIS — F17210 Nicotine dependence, cigarettes, uncomplicated: Secondary | ICD-10-CM | POA: Diagnosis not present

## 2021-04-29 DIAGNOSIS — L409 Psoriasis, unspecified: Secondary | ICD-10-CM | POA: Diagnosis not present

## 2021-04-29 DIAGNOSIS — I10 Essential (primary) hypertension: Secondary | ICD-10-CM | POA: Diagnosis not present

## 2021-04-29 DIAGNOSIS — E1169 Type 2 diabetes mellitus with other specified complication: Secondary | ICD-10-CM | POA: Diagnosis not present

## 2021-06-10 DIAGNOSIS — H60332 Swimmer's ear, left ear: Secondary | ICD-10-CM | POA: Diagnosis not present

## 2021-07-29 DIAGNOSIS — E7849 Other hyperlipidemia: Secondary | ICD-10-CM | POA: Diagnosis not present

## 2021-07-29 DIAGNOSIS — E1169 Type 2 diabetes mellitus with other specified complication: Secondary | ICD-10-CM | POA: Diagnosis not present

## 2021-07-29 DIAGNOSIS — I10 Essential (primary) hypertension: Secondary | ICD-10-CM | POA: Diagnosis not present

## 2021-07-29 DIAGNOSIS — Z0000 Encounter for general adult medical examination without abnormal findings: Secondary | ICD-10-CM | POA: Diagnosis not present

## 2021-07-29 DIAGNOSIS — L570 Actinic keratosis: Secondary | ICD-10-CM | POA: Diagnosis not present

## 2021-07-29 DIAGNOSIS — F17210 Nicotine dependence, cigarettes, uncomplicated: Secondary | ICD-10-CM | POA: Diagnosis not present

## 2021-07-29 DIAGNOSIS — L409 Psoriasis, unspecified: Secondary | ICD-10-CM | POA: Diagnosis not present

## 2021-09-09 DIAGNOSIS — H60332 Swimmer's ear, left ear: Secondary | ICD-10-CM | POA: Diagnosis not present

## 2021-10-28 DIAGNOSIS — I10 Essential (primary) hypertension: Secondary | ICD-10-CM | POA: Diagnosis not present

## 2021-10-28 DIAGNOSIS — L570 Actinic keratosis: Secondary | ICD-10-CM | POA: Diagnosis not present

## 2021-10-28 DIAGNOSIS — E7849 Other hyperlipidemia: Secondary | ICD-10-CM | POA: Diagnosis not present

## 2021-10-28 DIAGNOSIS — L409 Psoriasis, unspecified: Secondary | ICD-10-CM | POA: Diagnosis not present

## 2021-10-28 DIAGNOSIS — E1169 Type 2 diabetes mellitus with other specified complication: Secondary | ICD-10-CM | POA: Diagnosis not present

## 2021-10-28 DIAGNOSIS — F17210 Nicotine dependence, cigarettes, uncomplicated: Secondary | ICD-10-CM | POA: Diagnosis not present

## 2021-12-09 DIAGNOSIS — H60332 Swimmer's ear, left ear: Secondary | ICD-10-CM | POA: Diagnosis not present

## 2022-02-03 DIAGNOSIS — F17210 Nicotine dependence, cigarettes, uncomplicated: Secondary | ICD-10-CM | POA: Diagnosis not present

## 2022-02-03 DIAGNOSIS — E7849 Other hyperlipidemia: Secondary | ICD-10-CM | POA: Diagnosis not present

## 2022-02-03 DIAGNOSIS — L409 Psoriasis, unspecified: Secondary | ICD-10-CM | POA: Diagnosis not present

## 2022-02-03 DIAGNOSIS — L570 Actinic keratosis: Secondary | ICD-10-CM | POA: Diagnosis not present

## 2022-02-03 DIAGNOSIS — E1169 Type 2 diabetes mellitus with other specified complication: Secondary | ICD-10-CM | POA: Diagnosis not present

## 2022-02-03 DIAGNOSIS — I10 Essential (primary) hypertension: Secondary | ICD-10-CM | POA: Diagnosis not present

## 2022-03-10 DIAGNOSIS — H60332 Swimmer's ear, left ear: Secondary | ICD-10-CM | POA: Diagnosis not present

## 2022-05-12 DIAGNOSIS — L409 Psoriasis, unspecified: Secondary | ICD-10-CM | POA: Diagnosis not present

## 2022-05-12 DIAGNOSIS — I10 Essential (primary) hypertension: Secondary | ICD-10-CM | POA: Diagnosis not present

## 2022-05-12 DIAGNOSIS — E7849 Other hyperlipidemia: Secondary | ICD-10-CM | POA: Diagnosis not present

## 2022-05-12 DIAGNOSIS — E1169 Type 2 diabetes mellitus with other specified complication: Secondary | ICD-10-CM | POA: Diagnosis not present

## 2022-05-12 DIAGNOSIS — Z0000 Encounter for general adult medical examination without abnormal findings: Secondary | ICD-10-CM | POA: Diagnosis not present

## 2022-05-12 DIAGNOSIS — F17210 Nicotine dependence, cigarettes, uncomplicated: Secondary | ICD-10-CM | POA: Diagnosis not present

## 2022-06-16 DIAGNOSIS — H6123 Impacted cerumen, bilateral: Secondary | ICD-10-CM | POA: Diagnosis not present

## 2022-06-16 DIAGNOSIS — H608X3 Other otitis externa, bilateral: Secondary | ICD-10-CM | POA: Diagnosis not present

## 2022-08-11 DIAGNOSIS — I10 Essential (primary) hypertension: Secondary | ICD-10-CM | POA: Diagnosis not present

## 2022-08-11 DIAGNOSIS — Z6830 Body mass index (BMI) 30.0-30.9, adult: Secondary | ICD-10-CM | POA: Diagnosis not present

## 2022-08-11 DIAGNOSIS — F17210 Nicotine dependence, cigarettes, uncomplicated: Secondary | ICD-10-CM | POA: Diagnosis not present

## 2022-08-11 DIAGNOSIS — Z0000 Encounter for general adult medical examination without abnormal findings: Secondary | ICD-10-CM | POA: Diagnosis not present

## 2022-08-11 DIAGNOSIS — L409 Psoriasis, unspecified: Secondary | ICD-10-CM | POA: Diagnosis not present

## 2022-08-11 DIAGNOSIS — E7849 Other hyperlipidemia: Secondary | ICD-10-CM | POA: Diagnosis not present

## 2022-08-11 DIAGNOSIS — E1169 Type 2 diabetes mellitus with other specified complication: Secondary | ICD-10-CM | POA: Diagnosis not present

## 2022-09-15 DIAGNOSIS — H6123 Impacted cerumen, bilateral: Secondary | ICD-10-CM | POA: Diagnosis not present

## 2022-09-15 DIAGNOSIS — H608X3 Other otitis externa, bilateral: Secondary | ICD-10-CM | POA: Diagnosis not present

## 2022-09-22 DIAGNOSIS — F3342 Major depressive disorder, recurrent, in full remission: Secondary | ICD-10-CM | POA: Diagnosis not present

## 2022-11-17 DIAGNOSIS — Z0000 Encounter for general adult medical examination without abnormal findings: Secondary | ICD-10-CM | POA: Diagnosis not present

## 2022-11-17 DIAGNOSIS — I10 Essential (primary) hypertension: Secondary | ICD-10-CM | POA: Diagnosis not present

## 2022-11-17 DIAGNOSIS — Z6830 Body mass index (BMI) 30.0-30.9, adult: Secondary | ICD-10-CM | POA: Diagnosis not present

## 2022-11-17 DIAGNOSIS — E1143 Type 2 diabetes mellitus with diabetic autonomic (poly)neuropathy: Secondary | ICD-10-CM | POA: Diagnosis not present

## 2022-11-17 DIAGNOSIS — L409 Psoriasis, unspecified: Secondary | ICD-10-CM | POA: Diagnosis not present

## 2022-11-17 DIAGNOSIS — F17210 Nicotine dependence, cigarettes, uncomplicated: Secondary | ICD-10-CM | POA: Diagnosis not present

## 2022-11-17 DIAGNOSIS — E7849 Other hyperlipidemia: Secondary | ICD-10-CM | POA: Diagnosis not present

## 2022-11-24 DIAGNOSIS — Z0100 Encounter for examination of eyes and vision without abnormal findings: Secondary | ICD-10-CM | POA: Diagnosis not present

## 2022-11-24 DIAGNOSIS — H524 Presbyopia: Secondary | ICD-10-CM | POA: Diagnosis not present

## 2022-11-24 DIAGNOSIS — H35033 Hypertensive retinopathy, bilateral: Secondary | ICD-10-CM | POA: Diagnosis not present

## 2022-12-15 DIAGNOSIS — H6123 Impacted cerumen, bilateral: Secondary | ICD-10-CM | POA: Diagnosis not present

## 2022-12-15 DIAGNOSIS — H608X3 Other otitis externa, bilateral: Secondary | ICD-10-CM | POA: Diagnosis not present

## 2023-03-02 DIAGNOSIS — E1143 Type 2 diabetes mellitus with diabetic autonomic (poly)neuropathy: Secondary | ICD-10-CM | POA: Diagnosis not present

## 2023-03-02 DIAGNOSIS — E7849 Other hyperlipidemia: Secondary | ICD-10-CM | POA: Diagnosis not present

## 2023-03-02 DIAGNOSIS — Z6830 Body mass index (BMI) 30.0-30.9, adult: Secondary | ICD-10-CM | POA: Diagnosis not present

## 2023-03-02 DIAGNOSIS — F17210 Nicotine dependence, cigarettes, uncomplicated: Secondary | ICD-10-CM | POA: Diagnosis not present

## 2023-03-02 DIAGNOSIS — I10 Essential (primary) hypertension: Secondary | ICD-10-CM | POA: Diagnosis not present

## 2023-03-02 DIAGNOSIS — L409 Psoriasis, unspecified: Secondary | ICD-10-CM | POA: Diagnosis not present

## 2023-03-16 ENCOUNTER — Encounter (INDEPENDENT_AMBULATORY_CARE_PROVIDER_SITE_OTHER): Payer: Self-pay

## 2023-03-16 ENCOUNTER — Ambulatory Visit (INDEPENDENT_AMBULATORY_CARE_PROVIDER_SITE_OTHER): Payer: Medicare HMO | Admitting: Otolaryngology

## 2023-03-16 VITALS — Ht 65.0 in | Wt 220.0 lb

## 2023-03-16 DIAGNOSIS — H9313 Tinnitus, bilateral: Secondary | ICD-10-CM

## 2023-03-16 DIAGNOSIS — H608X2 Other otitis externa, left ear: Secondary | ICD-10-CM

## 2023-03-16 DIAGNOSIS — H6123 Impacted cerumen, bilateral: Secondary | ICD-10-CM

## 2023-03-16 DIAGNOSIS — H903 Sensorineural hearing loss, bilateral: Secondary | ICD-10-CM | POA: Diagnosis not present

## 2023-03-16 NOTE — Progress Notes (Unsigned)
Patient ID: Ivan Daugherty, male   DOB: 1965-09-23, 57 y.o.   MRN: [id number]  Follow-up: Chronic left ear infection, hearing loss, tinnitus  HPI: The patient is a 57 year old male who returns today for his follow-up evaluation. The patient has a history of chronic left otitis externa and chronic eczema.  He was also noted to have bilateral high-frequency sensorineural hearing loss and bilateral tinnitus.  He was previously treated with multiple debridement procedures and application of CSF powder and Ciprodex.  The patient returns today reporting persistent bilateral tinnitus.  He has occasional clogging sensation in his ears, worse on the left side.  He denies any otalgia or otorrhea.  He also denies any significant change in his hearing. No other ENT, GI, or respiratory issue noted since the last visit.   Exam: General: Communicates without difficulty, well nourished, no acute distress. Head: Normocephalic, no evidence injury, no tenderness, facial buttresses intact without stepoff. No scleral icterus, conjunctivae clear. Neuro: CN II exam reveals vision grossly intact. No nystagmus at any point of gaze. Ears: Bilateral cerumen impaction.  Nose: External evaluation reveals normal support and skin without lesions. Dorsum is intact. Anterior rhinoscopy reveals healthy pink mucosa over anterior aspect of inferior turbinates and intact septum. No purulence noted. Oral:  Oral cavity and oropharynx are intact, symmetric, without erythema or edema. Mucosa is moist without lesions. Neck: Full range of motion without pain. There is no significant lymphadenopathy. No masses palpable. Thyroid bed within normal limits to palpation. Parotid glands and submandibular glands equal bilaterally without mass. Neuro:  CN 2-12 grossly intact. Gait normal.   Procedure: Bilateral cerumen disimpaction Anesthesia: None Description: Under the operating microscope, the cerumen is carefully removed with a combination of cerumen  currette, alligator forceps, and suction catheters.  After the cerumen is removed, the TMs are noted to be normal.  Eczematous changes are noted in the left ear canal no mass, erythema, or lesions. The patient tolerated the procedure well.   Assessment  1.  Bilateral cerumen impaction. 2.  Chronic left eczematous otitis externa. 3.  Subjectively stable bilateral sensorineural hearing loss. 4.  His tinnitus is likely a direct result of his hearing loss.  Plan \ 1.  Otomicroscopy with bilateral cerumen disimpaction. 2.  The physical exam findings are reviewed with the patient.  3.  Continue with Ciprodex ear drops weekly.  4.  The strategies to cope with tinnitus, including the use of masker, hearing aids, tinnitus retraining therapy, and avoidance of caffeine and alcohol are discussed. 5.  The patient will return for re-evaluation in 3 months.

## 2023-03-17 DIAGNOSIS — H9313 Tinnitus, bilateral: Secondary | ICD-10-CM | POA: Insufficient documentation

## 2023-03-17 DIAGNOSIS — H6062 Unspecified chronic otitis externa, left ear: Secondary | ICD-10-CM | POA: Insufficient documentation

## 2023-03-17 DIAGNOSIS — H903 Sensorineural hearing loss, bilateral: Secondary | ICD-10-CM | POA: Insufficient documentation

## 2023-03-17 DIAGNOSIS — H6123 Impacted cerumen, bilateral: Secondary | ICD-10-CM | POA: Insufficient documentation

## 2023-06-14 ENCOUNTER — Telehealth (INDEPENDENT_AMBULATORY_CARE_PROVIDER_SITE_OTHER): Payer: Self-pay | Admitting: Otolaryngology

## 2023-06-14 NOTE — Telephone Encounter (Signed)
 Tried to confirm appt & location - VM not set-up [phone]

## 2023-06-15 ENCOUNTER — Encounter (INDEPENDENT_AMBULATORY_CARE_PROVIDER_SITE_OTHER): Payer: Self-pay

## 2023-06-15 ENCOUNTER — Ambulatory Visit (INDEPENDENT_AMBULATORY_CARE_PROVIDER_SITE_OTHER): Payer: Medicare HMO | Admitting: Otolaryngology

## 2023-06-15 VITALS — BP 166/74 | HR 70 | Ht 67.0 in | Wt 220.0 lb

## 2023-06-15 DIAGNOSIS — H6092 Unspecified otitis externa, left ear: Secondary | ICD-10-CM | POA: Diagnosis not present

## 2023-06-15 DIAGNOSIS — H6123 Impacted cerumen, bilateral: Secondary | ICD-10-CM | POA: Diagnosis not present

## 2023-06-15 DIAGNOSIS — H60332 Swimmer's ear, left ear: Secondary | ICD-10-CM

## 2023-06-15 DIAGNOSIS — H903 Sensorineural hearing loss, bilateral: Secondary | ICD-10-CM

## 2023-06-15 DIAGNOSIS — H9313 Tinnitus, bilateral: Secondary | ICD-10-CM

## 2023-06-16 NOTE — Progress Notes (Signed)
 Patient ID: Ivan Daugherty, male   DOB: 02/10/1966, 58 y.o.   MRN: [id number]  Follow-up: Chronic eczematous otitis externa, chronic left ear infection, hearing loss, tinnitus  HPI: The patient is a 58 year old male who returns today for his follow-up evaluation.  The patient was previously seen for chronic left eczematous otitis externa and recurrent swimmers ears.  He also has a history of recurrent cerumen impaction, bilateral high-frequency sensorineural hearing loss, and bilateral tinnitus.  He was previously treated with CSF powder and Ciprodex eardrops.  The patient returns today complaining of increasing clogging sensation in his ears.  He denies any significant otalgia, otorrhea, or recent change in his hearing.  Exam: General: Communicates without difficulty, well nourished, no acute distress. Head: Normocephalic, no evidence injury, no tenderness, facial buttresses intact without stepoff. No scleral icterus, conjunctivae clear. Neuro: CN II exam reveals vision grossly intact. No nystagmus at any point of gaze. Ears: Bilateral cerumen impaction.  Nose: External evaluation reveals normal support and skin without lesions. Dorsum is intact. Anterior rhinoscopy reveals healthy pink mucosa over anterior aspect of inferior turbinates and intact septum. No purulence noted. Oral:  Oral cavity and oropharynx are intact, symmetric, without erythema or edema. Mucosa is moist without lesions. Neck: Full range of motion without pain. There is no significant lymphadenopathy. No masses palpable. Thyroid bed within normal limits to palpation. Parotid glands and submandibular glands equal bilaterally without mass. Neuro:  CN 2-12 grossly intact. Gait normal.    Procedure: Bilateral cerumen disimpaction Anesthesia: None Description: Under the operating microscope, the cerumen is carefully removed with a combination of cerumen currette, alligator forceps, and suction catheters.  After the cerumen is removed, the TMs  are noted to be normal.  Eczematous changes are noted in the left ear canal no mass, erythema, or lesions. The patient tolerated the procedure well.    Assessment: 1.  Bilateral cerumen impaction. 2.  Chronic left eczematous otitis externa. 3.  Subjectively stable bilateral sensorineural hearing loss and tinnitus.   Plan: 1.  Otomicroscopy with bilateral cerumen disimpaction. 2.  The physical exam findings are reviewed with the patient.  3.  Continue with Ciprodex ear drops weekly.  4.  The patient will return for re-evaluation in 3 months.

## 2023-06-23 DIAGNOSIS — H8113 Benign paroxysmal vertigo, bilateral: Secondary | ICD-10-CM | POA: Diagnosis not present

## 2023-06-23 DIAGNOSIS — I161 Hypertensive emergency: Secondary | ICD-10-CM | POA: Diagnosis not present

## 2023-06-23 DIAGNOSIS — Z6830 Body mass index (BMI) 30.0-30.9, adult: Secondary | ICD-10-CM | POA: Diagnosis not present

## 2023-07-06 DIAGNOSIS — Z6830 Body mass index (BMI) 30.0-30.9, adult: Secondary | ICD-10-CM | POA: Diagnosis not present

## 2023-07-06 DIAGNOSIS — Z0000 Encounter for general adult medical examination without abnormal findings: Secondary | ICD-10-CM | POA: Diagnosis not present

## 2023-07-06 DIAGNOSIS — E7849 Other hyperlipidemia: Secondary | ICD-10-CM | POA: Diagnosis not present

## 2023-07-06 DIAGNOSIS — I10 Essential (primary) hypertension: Secondary | ICD-10-CM | POA: Diagnosis not present

## 2023-07-06 DIAGNOSIS — L409 Psoriasis, unspecified: Secondary | ICD-10-CM | POA: Diagnosis not present

## 2023-07-06 DIAGNOSIS — E1143 Type 2 diabetes mellitus with diabetic autonomic (poly)neuropathy: Secondary | ICD-10-CM | POA: Diagnosis not present

## 2023-07-06 DIAGNOSIS — H8113 Benign paroxysmal vertigo, bilateral: Secondary | ICD-10-CM | POA: Diagnosis not present

## 2023-08-16 ENCOUNTER — Encounter (INDEPENDENT_AMBULATORY_CARE_PROVIDER_SITE_OTHER): Payer: Self-pay | Admitting: Otolaryngology

## 2023-08-16 ENCOUNTER — Ambulatory Visit (INDEPENDENT_AMBULATORY_CARE_PROVIDER_SITE_OTHER): Admitting: Otolaryngology

## 2023-08-16 VITALS — BP 145/74 | HR 65 | Ht 65.5 in | Wt 201.0 lb

## 2023-08-16 DIAGNOSIS — H6123 Impacted cerumen, bilateral: Secondary | ICD-10-CM

## 2023-08-16 DIAGNOSIS — H6092 Unspecified otitis externa, left ear: Secondary | ICD-10-CM

## 2023-08-16 DIAGNOSIS — H903 Sensorineural hearing loss, bilateral: Secondary | ICD-10-CM | POA: Diagnosis not present

## 2023-08-16 DIAGNOSIS — H60332 Swimmer's ear, left ear: Secondary | ICD-10-CM

## 2023-08-16 DIAGNOSIS — H9313 Tinnitus, bilateral: Secondary | ICD-10-CM | POA: Diagnosis not present

## 2023-08-16 NOTE — Progress Notes (Signed)
 Patient ID: Ivan Daugherty, male   DOB: 21-Jul-1965, 58 y.o.   MRN: [id number]  Follow-up: Chronic left ear infection, bilateral hearing loss, bilateral tinnitus  HPI: The patient is a 57 year old male who returns today for his follow-up evaluation.  The patient has a history of recurrent swimmers ears, especially on the left side.  He also has a history of chronic eczematous otitis externa.  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  The patient returns today complaining of intermittent left ear pain.  He also complains of increasing clogging sensation in his ears.  He continues to have bilateral high-pitched tinnitus.  Exam: General: Communicates without difficulty, well nourished, no acute distress. Head: Normocephalic, no evidence injury, no tenderness, facial buttresses intact without stepoff. No scleral icterus, conjunctivae clear. Neuro: CN II exam reveals vision grossly intact. No nystagmus at any point of gaze. Ears: Bilateral cerumen impaction.  Nose: External evaluation reveals normal support and skin without lesions. Dorsum is intact. Anterior rhinoscopy reveals healthy pink mucosa over anterior aspect of inferior turbinates and intact septum. No purulence noted. Oral:  Oral cavity and oropharynx are intact, symmetric, without erythema or edema. Mucosa is moist without lesions. Neck: Full range of motion without pain. There is no significant lymphadenopathy. No masses palpable. Thyroid  bed within normal limits to palpation. Parotid glands and submandibular glands equal bilaterally without mass. Neuro:  CN 2-12 grossly intact. Gait normal.    Procedure: Bilateral cerumen disimpaction Anesthesia: None Description: Under the operating microscope, the cerumen is carefully removed with a combination of cerumen currette, alligator forceps, and suction catheters.  After the cerumen is removed, the TMs are noted to be normal.   Eczematous changes are noted in the left ear canal no mass, erythema, or lesions. The patient tolerated the procedure well.    Assessment: 1.  Bilateral recurrent cerumen impaction. 2.  Chronic eczematous otitis externa on the left side.  He also has frequent recurrent swimmer's ear. 3.  Subjectively stable bilateral sensorineural hearing loss and tinnitus.  Plan: 1.  Otomicroscopy with bilateral cerumen disimpaction. 2.  Continue with weekly Ciprodex eardrops. 3.  The physical exam findings are reviewed with the patient. 4.  The patient will return for reevaluation in 3 months.

## 2023-08-18 ENCOUNTER — Telehealth (INDEPENDENT_AMBULATORY_CARE_PROVIDER_SITE_OTHER): Payer: Self-pay

## 2023-08-18 NOTE — Telephone Encounter (Signed)
 I tried to reach the patient to ask about his ear pain and to make sure he is using his drops, the patient does not have a voicemail that has been set up

## 2023-08-24 DIAGNOSIS — F3342 Major depressive disorder, recurrent, in full remission: Secondary | ICD-10-CM | POA: Diagnosis not present

## 2023-09-21 ENCOUNTER — Ambulatory Visit (INDEPENDENT_AMBULATORY_CARE_PROVIDER_SITE_OTHER): Admitting: Otolaryngology

## 2023-10-12 DIAGNOSIS — L409 Psoriasis, unspecified: Secondary | ICD-10-CM | POA: Diagnosis not present

## 2023-10-12 DIAGNOSIS — E1143 Type 2 diabetes mellitus with diabetic autonomic (poly)neuropathy: Secondary | ICD-10-CM | POA: Diagnosis not present

## 2023-10-12 DIAGNOSIS — H8113 Benign paroxysmal vertigo, bilateral: Secondary | ICD-10-CM | POA: Diagnosis not present

## 2023-10-12 DIAGNOSIS — Z125 Encounter for screening for malignant neoplasm of prostate: Secondary | ICD-10-CM | POA: Diagnosis not present

## 2023-10-12 DIAGNOSIS — E7849 Other hyperlipidemia: Secondary | ICD-10-CM | POA: Diagnosis not present

## 2023-10-12 DIAGNOSIS — I10 Essential (primary) hypertension: Secondary | ICD-10-CM | POA: Diagnosis not present

## 2023-10-12 DIAGNOSIS — M25421 Effusion, right elbow: Secondary | ICD-10-CM | POA: Diagnosis not present

## 2023-10-12 DIAGNOSIS — Z0000 Encounter for general adult medical examination without abnormal findings: Secondary | ICD-10-CM | POA: Diagnosis not present

## 2023-10-12 DIAGNOSIS — M25521 Pain in right elbow: Secondary | ICD-10-CM | POA: Diagnosis not present

## 2023-10-12 DIAGNOSIS — M71321 Other bursal cyst, right elbow: Secondary | ICD-10-CM | POA: Diagnosis not present

## 2023-10-12 DIAGNOSIS — Z6829 Body mass index (BMI) 29.0-29.9, adult: Secondary | ICD-10-CM | POA: Diagnosis not present

## 2023-11-16 ENCOUNTER — Ambulatory Visit (INDEPENDENT_AMBULATORY_CARE_PROVIDER_SITE_OTHER): Admitting: Otolaryngology

## 2023-11-16 ENCOUNTER — Encounter (INDEPENDENT_AMBULATORY_CARE_PROVIDER_SITE_OTHER): Payer: Self-pay | Admitting: Otolaryngology

## 2023-11-16 VITALS — BP 122/71 | HR 66

## 2023-11-16 DIAGNOSIS — H903 Sensorineural hearing loss, bilateral: Secondary | ICD-10-CM | POA: Diagnosis not present

## 2023-11-16 DIAGNOSIS — H9313 Tinnitus, bilateral: Secondary | ICD-10-CM | POA: Diagnosis not present

## 2023-11-16 DIAGNOSIS — H6123 Impacted cerumen, bilateral: Secondary | ICD-10-CM | POA: Diagnosis not present

## 2023-11-16 DIAGNOSIS — H6092 Unspecified otitis externa, left ear: Secondary | ICD-10-CM | POA: Diagnosis not present

## 2023-11-16 DIAGNOSIS — H60332 Swimmer's ear, left ear: Secondary | ICD-10-CM

## 2023-11-17 NOTE — Progress Notes (Signed)
 Patient ID: Ivan Daugherty, male   DOB: 1965/04/05, 58 y.o.   MRN: [id number]  Follow-up: Chronic left ear infection, bilateral hearing loss, bilateral tinnitus  HPI: The patient is a 58 year old male who returns today for his follow-up evaluation.  The patient has a history of recurrent acute otitis externa, especially on the left side.  He also has a history of chronic eczematous otitis externa bilaterally.  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  The patient returns today complaining of recurrent left ear pain.  He also complains of increasing clogging sensation in his ears.  He continues to have bilateral high-pitched tinnitus.  Exam: General: Communicates without difficulty, well nourished, no acute distress. Head: Normocephalic, no evidence injury, no tenderness, facial buttresses intact without stepoff. No scleral icterus, conjunctivae clear. Neuro: CN II exam reveals vision grossly intact. No nystagmus at any point of gaze. Ears: Bilateral cerumen impaction.  Nose: External evaluation reveals normal support and skin without lesions. Dorsum is intact. Anterior rhinoscopy reveals healthy pink mucosa over anterior aspect of inferior turbinates and intact septum. No purulence noted. Oral:  Oral cavity and oropharynx are intact, symmetric, without erythema or edema. Mucosa is moist without lesions. Neck: Full range of motion without pain. There is no significant lymphadenopathy. No masses palpable. Thyroid  bed within normal limits to palpation. Parotid glands and submandibular glands equal bilaterally without mass. Neuro:  CN 2-12 grossly intact. Gait normal.    Procedure: Bilateral cerumen disimpaction Anesthesia: None Description: Under the operating microscope, the cerumen is carefully removed with a combination of cerumen currette, alligator forceps, and suction catheters.  After the cerumen is removed, the TMs are noted to  be normal.  Eczematous changes are noted in the left ear canal no mass, erythema, or lesions. The patient tolerated the procedure well.    Assessment: 1.  Bilateral recurrent cerumen impaction. 2.  Chronic eczematous otitis externa on the left side.  He also has frequent recurrent acute otitis externa. 3.  Subjectively stable bilateral sensorineural hearing loss and tinnitus.  Plan: 1.  Otomicroscopy with bilateral cerumen disimpaction. 2.  Continue with weekly Ciprodex eardrops. 3.  The physical exam findings are reviewed with the patient. 4.  The patient will return for reevaluation in 3 months.

## 2023-11-23 DIAGNOSIS — H35032 Hypertensive retinopathy, left eye: Secondary | ICD-10-CM | POA: Diagnosis not present

## 2024-01-18 DIAGNOSIS — H8113 Benign paroxysmal vertigo, bilateral: Secondary | ICD-10-CM | POA: Diagnosis not present

## 2024-01-18 DIAGNOSIS — M25421 Effusion, right elbow: Secondary | ICD-10-CM | POA: Diagnosis not present

## 2024-01-18 DIAGNOSIS — I10 Essential (primary) hypertension: Secondary | ICD-10-CM | POA: Diagnosis not present

## 2024-01-18 DIAGNOSIS — L409 Psoriasis, unspecified: Secondary | ICD-10-CM | POA: Diagnosis not present

## 2024-01-18 DIAGNOSIS — E1143 Type 2 diabetes mellitus with diabetic autonomic (poly)neuropathy: Secondary | ICD-10-CM | POA: Diagnosis not present

## 2024-01-18 DIAGNOSIS — E7849 Other hyperlipidemia: Secondary | ICD-10-CM | POA: Diagnosis not present

## 2024-01-18 DIAGNOSIS — Z6829 Body mass index (BMI) 29.0-29.9, adult: Secondary | ICD-10-CM | POA: Diagnosis not present

## 2024-02-15 ENCOUNTER — Encounter (INDEPENDENT_AMBULATORY_CARE_PROVIDER_SITE_OTHER): Payer: Self-pay | Admitting: Otolaryngology

## 2024-02-15 ENCOUNTER — Ambulatory Visit (INDEPENDENT_AMBULATORY_CARE_PROVIDER_SITE_OTHER): Admitting: Otolaryngology

## 2024-02-15 VITALS — BP 135/71 | HR 80 | Ht 66.0 in | Wt 198.0 lb

## 2024-02-15 DIAGNOSIS — H6123 Impacted cerumen, bilateral: Secondary | ICD-10-CM

## 2024-02-15 DIAGNOSIS — H9313 Tinnitus, bilateral: Secondary | ICD-10-CM | POA: Diagnosis not present

## 2024-02-15 DIAGNOSIS — H60332 Swimmer's ear, left ear: Secondary | ICD-10-CM

## 2024-02-15 DIAGNOSIS — H608X2 Other otitis externa, left ear: Secondary | ICD-10-CM | POA: Diagnosis not present

## 2024-02-15 DIAGNOSIS — H903 Sensorineural hearing loss, bilateral: Secondary | ICD-10-CM | POA: Diagnosis not present

## 2024-02-15 NOTE — Progress Notes (Signed)
 Patient ID: Ivan Daugherty, male   DOB: [birth year], 58 y.o.   MRN: [id number]  Follow-up: Chronic left ear infection, hearing loss, tinnitus  HPI: The patient is a 58 year old male who returns today for his follow-up evaluation.  The patient has a history of recurrent acute otitis externa, especially on the left side.  He also has a history of chronic eczematous otitis externa bilaterally.  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  The patient returns today reporting no recent known infection.  He denies any significant otalgia or otorrhea.  He has noted increasing clogging sensation in his ears.  He continues to have bilateral high-pitched tinnitus.  Exam: General: Communicates without difficulty, well nourished, no acute distress. Head: Normocephalic, no evidence injury, no tenderness, facial buttresses intact without stepoff. No scleral icterus, conjunctivae clear. Neuro: CN II exam reveals vision grossly intact. No nystagmus at any point of gaze. Ears: Bilateral cerumen impaction.  Nose: External evaluation reveals normal support and skin without lesions. Dorsum is intact. Anterior rhinoscopy reveals healthy pink mucosa over anterior aspect of inferior turbinates and intact septum. No purulence noted. Oral:  Oral cavity and oropharynx are intact, symmetric, without erythema or edema. Mucosa is moist without lesions. Neck: Full range of motion without pain. There is no significant lymphadenopathy. No masses palpable. Thyroid  bed within normal limits to palpation. Parotid glands and submandibular glands equal bilaterally without mass. Neuro:  CN 2-12 grossly intact. Gait normal.    Procedure: Bilateral cerumen disimpaction Anesthesia: None Description: Under the operating microscope, the cerumen is carefully removed with a combination of cerumen currette, alligator forceps, and suction catheters.  After the cerumen is removed, the  TMs are noted to be normal.  Eczematous changes are noted in the left ear canal no mass, erythema, or lesions. The patient tolerated the procedure well.    Assessment: 1.  Bilateral recurrent cerumen impaction. 2.  Chronic eczematous otitis externa on the left side.  He also has frequent recurrent acute otitis externa. 3.  Subjectively stable bilateral sensorineural hearing loss and tinnitus.  Plan: 1.  Otomicroscopy with bilateral cerumen disimpaction. 2.  Continue with weekly Ciprodex eardrops. 3.  The physical exam findings are reviewed with the patient. 4.  The patient will return for reevaluation in 4 months.

## 2024-02-29 ENCOUNTER — Telehealth (INDEPENDENT_AMBULATORY_CARE_PROVIDER_SITE_OTHER): Payer: Self-pay | Admitting: Otolaryngology

## 2024-02-29 NOTE — Telephone Encounter (Signed)
 The patient called in and requested a refills of ciprodex ear drops and dehamedhin to [facility] pharmacy in [place] please. I am unsure of the spelling of the second medication, he was reading it off to me.

## 2024-03-01 ENCOUNTER — Telehealth (INDEPENDENT_AMBULATORY_CARE_PROVIDER_SITE_OTHER): Payer: Self-pay | Admitting: Otolaryngology

## 2024-03-01 NOTE — Telephone Encounter (Signed)
 Prescription ear drops was faxed to [facility] in [place], [place] with prn refill for one year.

## 2024-06-13 ENCOUNTER — Ambulatory Visit (INDEPENDENT_AMBULATORY_CARE_PROVIDER_SITE_OTHER): Admitting: Otolaryngology
